# Patient Record
Sex: Female | Born: 1990 | Race: Black or African American | Hispanic: No | Marital: Single | Smoking: Never smoker
Health system: Southern US, Community
[De-identification: ages and names within clinical notes are randomized; demographics above are authoritative.]

## PROBLEM LIST (undated history)

## (undated) ENCOUNTER — Inpatient Hospital Stay (HOSPITAL_COMMUNITY): Payer: Self-pay

## (undated) DIAGNOSIS — R51 Headache: Secondary | ICD-10-CM

## (undated) DIAGNOSIS — D649 Anemia, unspecified: Secondary | ICD-10-CM

## (undated) DIAGNOSIS — N730 Acute parametritis and pelvic cellulitis: Secondary | ICD-10-CM

## (undated) DIAGNOSIS — E611 Iron deficiency: Secondary | ICD-10-CM

## (undated) DIAGNOSIS — R519 Headache, unspecified: Secondary | ICD-10-CM

## (undated) HISTORY — PX: EYE SURGERY: SHX253

## (undated) HISTORY — DX: Acute parametritis and pelvic cellulitis: N73.0

---

## 2011-11-14 ENCOUNTER — Encounter (HOSPITAL_COMMUNITY): Payer: Self-pay

## 2011-11-14 ENCOUNTER — Emergency Department (HOSPITAL_COMMUNITY)
Admission: EM | Admit: 2011-11-14 | Discharge: 2011-11-14 | Disposition: A | Discharge status: Home / Self Care | Payer: BC Managed Care – PPO | Attending: Emergency Medicine | Admitting: Emergency Medicine

## 2011-11-14 ENCOUNTER — Emergency Department (HOSPITAL_COMMUNITY): Payer: BC Managed Care – PPO

## 2011-11-14 DIAGNOSIS — B9789 Other viral agents as the cause of diseases classified elsewhere: Secondary | ICD-10-CM | POA: Insufficient documentation

## 2011-11-14 DIAGNOSIS — B349 Viral infection, unspecified: Secondary | ICD-10-CM

## 2011-11-14 LAB — COMPREHENSIVE METABOLIC PANEL
BUN: 9 mg/dL (ref 6–23)
CO2: 25 mEq/L (ref 19–32)
Calcium: 9.7 mg/dL (ref 8.4–10.5)
Creatinine, Ser: 0.65 mg/dL (ref 0.50–1.10)
GFR calc Af Amer: >90 mL/min (ref >90)
GFR calc non Af Amer: >90 mL/min (ref >90)
Glucose, Bld: 91 mg/dL (ref 70–99)

## 2011-11-14 LAB — CBC WITH DIFFERENTIAL/PLATELET
Eosinophils Relative: 1 % (ref 0–5)
HCT: 34.8 % — ABNORMAL LOW (ref 36.0–46.0)
Lymphocytes Relative: 37 % (ref 12–46)
Lymphs Abs: 1.1 10*3/uL (ref 0.7–4.0)
MCV: 80.7 fL (ref 78.0–100.0)
Monocytes Absolute: 0.3 10*3/uL (ref 0.1–1.0)
RBC: 4.31 MIL/uL (ref 3.87–5.11)
WBC: 2.9 10*3/uL — ABNORMAL LOW (ref 4.0–10.5)

## 2011-11-14 LAB — URINALYSIS, ROUTINE W REFLEX MICROSCOPIC
Protein, ur: NEGATIVE mg/dL
Urobilinogen, UA: 1 mg/dL (ref 0.0–1.0)

## 2011-11-14 LAB — POCT I-STAT TROPONIN I: Troponin i, poc: 0.01 ng/mL (ref 0.00–0.08)

## 2011-11-14 LAB — URINE MICROSCOPIC-ADD ON

## 2011-11-14 NOTE — ED Provider Notes (Signed)
History   This chart was scribed for Evelyn Baker, MD by Charolett Bumpers . The patient was seen in room TR02C/TR02C. Patient's care was started at 1438.   CSN: [id number] Arrival date & time 11/14/11  1324  First MD Initiated Contact with Patient 11/14/11 1438      Chief Complaint  Patient presents with  . Palpitations    The history is provided by the patient. No language interpreter was used.  Evelyn Sullivan is a 21 y.o. female who presents to the Emergency Department complaining of a intermittent episodes of palpitations that occurred last night while going to bed. She reports associated chills. She states that she is going to travel by airplane today and wants to get evaluated prior. She denies any fevers, chest pain, SOB, cough, vomiting, diarrhea, dysuria, sore throat, ear pain or congestion. She denies any recent illnesses. She denies any modifying factors. Denies any complaints of pain at this time.   No past medical history on file.  No past surgical history on file.  No family history on file.  History  Substance Use Topics  . Smoking status: Not on file  . Smokeless tobacco: Not on file  . Alcohol Use: Not on file    OB History    Grav Para Term Preterm Abortions TAB SAB Ect Mult Living                  Review of Systems  Constitutional: Positive for chills. Negative for fever.  HENT: Negative for ear pain, congestion and sore throat.   Respiratory: Negative for cough and shortness of breath.   Cardiovascular: Positive for palpitations. Negative for chest pain.  Gastrointestinal: Negative for vomiting, abdominal pain and diarrhea.  Genitourinary: Negative for dysuria.  Neurological: Negative for weakness.  All other systems reviewed and are negative.    Allergies  Review of patient's allergies indicates no known allergies.  Home Medications   Current Outpatient Rx  Name Route Sig Dispense Refill  . ADVIL PO Oral Take 2 tablets by mouth  every 8 (eight) hours as needed. For pain      BP 107/62  Pulse 94  Temp 98.9 F (37.2 C) (Oral)  Resp 18  SpO2 95%  LMP 11/13/2011  Physical Exam  Nursing note and vitals reviewed. Constitutional: She is oriented to person, place, and time. She appears well-developed and well-nourished.  Non-toxic appearance. No distress.  HENT:  Head: Normocephalic and atraumatic.  Mouth/Throat: Oropharynx is clear and moist. No oropharyngeal exudate.  Eyes: Conjunctivae normal, EOM and lids are normal. Pupils are equal, round, and reactive to light.  Neck: Normal range of motion. Neck supple. No tracheal deviation present. No mass present.  Cardiovascular: Normal rate, regular rhythm and normal heart sounds.  Exam reveals no gallop.   No murmur heard. Pulmonary/Chest: Effort normal and breath sounds normal. No stridor. No respiratory distress. She has no decreased breath sounds. She has no wheezes. She has no rhonchi. She has no rales.  Abdominal: Soft. Normal appearance and bowel sounds are normal. She exhibits no distension. There is no tenderness. There is no rebound and no CVA tenderness.  Musculoskeletal: Normal range of motion. She exhibits no edema and no tenderness.  Neurological: She is alert and oriented to person, place, and time. She has normal strength. No cranial nerve deficit or sensory deficit. GCS eye subscore is 4. GCS verbal subscore is 5. GCS motor subscore is 6.  Skin: Skin is warm and dry. No abrasion  and no rash noted.  Psychiatric: She has a normal mood and affect. Her speech is normal and behavior is normal.    ED Course  Procedures (including critical care time)  DIAGNOSTIC STUDIES: Oxygen Saturation is 95% on room air, adequate by my interpretation.    COORDINATION OF CARE:  15:03-Discussed planned course of treatment with the patient including a chest x-ray, UA and blood work, who is agreeable at this time.   15:48-Recheck: Informed pt of normal imaging and lab  results. Will d/c home.   Results for orders placed during the hospital encounter of 11/14/11  CBC WITH DIFFERENTIAL      Component Value Range   WBC 2.9 (*) 4.0 - 10.5 K/uL   RBC 4.31  3.87 - 5.11 MIL/uL   Hemoglobin 11.4 (*) 12.0 - 15.0 g/dL   HCT 21.3 (*) 08.6 - 57.8 %   MCV 80.7  78.0 - 100.0 fL   MCH 26.5  26.0 - 34.0 pg   MCHC 32.8  30.0 - 36.0 g/dL   RDW 46.9  62.9 - 52.8 %   Platelets 208  150 - 400 K/uL   Neutrophils Relative 53  43 - 77 %   Neutro Abs 1.5 (*) 1.7 - 7.7 K/uL   Lymphocytes Relative 37  12 - 46 %   Lymphs Abs 1.1  0.7 - 4.0 K/uL   Monocytes Relative 9  3 - 12 %   Monocytes Absolute 0.3  0.1 - 1.0 K/uL   Eosinophils Relative 1  0 - 5 %   Eosinophils Absolute 0.0  0.0 - 0.7 K/uL   Basophils Relative 0  0 - 1 %   Basophils Absolute 0.0  0.0 - 0.1 K/uL  COMPREHENSIVE METABOLIC PANEL      Component Value Range   Sodium 136  135 - 145 mEq/L   Potassium 3.9  3.5 - 5.1 mEq/L   Chloride 102  96 - 112 mEq/L   CO2 25  19 - 32 mEq/L   Glucose, Bld 91  70 - 99 mg/dL   BUN 9  6 - 23 mg/dL   Creatinine, Ser 4.13  0.50 - 1.10 mg/dL   Calcium 9.7  8.4 - 24.4 mg/dL   Total Protein 8.1  6.0 - 8.3 g/dL   Albumin 4.1  3.5 - 5.2 g/dL   AST 17  0 - 37 U/L   ALT 7  0 - 35 U/L   Alkaline Phosphatase 52  39 - 117 U/L   Total Bilirubin 0.3  0.3 - 1.2 mg/dL   GFR calc non Af Amer >90  >90 mL/min   GFR calc Af Amer >90  >90 mL/min  TROPONIN I      Component Value Range   Troponin I <0.30  <0.30 ng/mL  URINALYSIS, ROUTINE W REFLEX MICROSCOPIC      Component Value Range   Color, Urine AMBER (*) YELLOW   APPearance CLOUDY (*) CLEAR   Specific Gravity, Urine 1.039 (*) 1.005 - 1.030   pH 6.5  5.0 - 8.0   Glucose, UA NEGATIVE  NEGATIVE mg/dL   Hgb urine dipstick LARGE (*) NEGATIVE   Bilirubin Urine NEGATIVE  NEGATIVE   Ketones, ur 15 (*) NEGATIVE mg/dL   Protein, ur NEGATIVE  NEGATIVE mg/dL   Urobilinogen, UA 1.0  0.0 - 1.0 mg/dL   Nitrite NEGATIVE  NEGATIVE    Leukocytes, UA NEGATIVE  NEGATIVE  POCT I-STAT TROPONIN I      Component Value Range  Troponin i, poc 0.01  0.00 - 0.08 ng/mL   Comment 3           URINE MICROSCOPIC-ADD ON      Component Value Range   Squamous Epithelial / LPF RARE  RARE   WBC, UA 0-2  <3 WBC/hpf   RBC / HPF TOO NUMEROUS TO COUNT  <3 RBC/hpf   Bacteria, UA RARE  RARE   Urine-Other MUCOUS PRESENT      Dg Chest 2 View  11/14/2011  *RADIOLOGY REPORT*  Clinical Data: Rapid heartbeat  CHEST - 2 VIEW  Comparison: None.  Findings: Mild to moderate convex right scoliotic curvature of the thoracic spine.  Heart size and vascular pattern are normal.  Lungs are clear.  IMPRESSION: No acute findings.   Original Report Authenticated By: Otilio Carpen, M.D.      No diagnosis found.    MDM  Patient's labs and x-rays reviewed. She likely has a viral illness. 2 stable for discharge   I personally performed the services described in this documentation, which was scribed in my presence. The recorded information has been reviewed and considered.     Rate: 81   Rhythm: normal sinus rhythm  QRS Axis: normal  Intervals: normal  ST/T Wave abnormalities: normal  Conduction Disutrbances:none  Narrative Interpretation:   Old EKG Reviewed: none available    Evelyn Baker, MD 11/14/11 1549

## 2011-11-14 NOTE — ED Notes (Signed)
Pt complaisn of heart racing last night and shivering.

## 2011-11-14 NOTE — ED Notes (Signed)
Pt had an episode last pm of "heart racing". States she drank milk which made her feel better. She is going to be traveling by airplane today so wanted to "get checked first". Denies CP or SOB.

## 2012-01-30 ENCOUNTER — Encounter (HOSPITAL_COMMUNITY): Payer: Self-pay | Admitting: Adult Health

## 2012-01-30 ENCOUNTER — Emergency Department (HOSPITAL_COMMUNITY)
Admission: EM | Admit: 2012-01-30 | Discharge: 2012-01-31 | Disposition: A | Discharge status: Home / Self Care | Payer: BC Managed Care – PPO | Attending: Emergency Medicine | Admitting: Emergency Medicine

## 2012-01-30 ENCOUNTER — Emergency Department (HOSPITAL_COMMUNITY): Payer: BC Managed Care – PPO

## 2012-01-30 DIAGNOSIS — R350 Frequency of micturition: Secondary | ICD-10-CM | POA: Insufficient documentation

## 2012-01-30 DIAGNOSIS — R51 Headache: Secondary | ICD-10-CM

## 2012-01-30 DIAGNOSIS — R358 Other polyuria: Secondary | ICD-10-CM

## 2012-01-30 DIAGNOSIS — R3589 Other polyuria: Secondary | ICD-10-CM

## 2012-01-30 DIAGNOSIS — D649 Anemia, unspecified: Secondary | ICD-10-CM

## 2012-01-30 DIAGNOSIS — H53149 Visual discomfort, unspecified: Secondary | ICD-10-CM | POA: Insufficient documentation

## 2012-01-30 LAB — POCT PREGNANCY, URINE: Preg Test, Ur: NEGATIVE

## 2012-01-30 LAB — POCT I-STAT, CHEM 8
HCT: 35 % — ABNORMAL LOW (ref 36.0–46.0)
Hemoglobin: 11.9 g/dL — ABNORMAL LOW (ref 12.0–15.0)
Sodium: 139 mEq/L (ref 135–145)
TCO2: 25 mmol/L (ref 0–100)

## 2012-01-30 LAB — URINALYSIS, ROUTINE W REFLEX MICROSCOPIC
Glucose, UA: NEGATIVE mg/dL
Leukocytes, UA: NEGATIVE
Nitrite: NEGATIVE
Protein, ur: NEGATIVE mg/dL
Urobilinogen, UA: 1 mg/dL (ref 0.0–1.0)

## 2012-01-30 LAB — CBC
MCH: 26.3 pg (ref 26.0–34.0)
MCHC: 32.6 g/dL (ref 30.0–36.0)
Platelets: 156 10*3/uL (ref 150–400)
RBC: 3.96 MIL/uL (ref 3.87–5.11)
RDW: 13.7 % (ref 11.5–15.5)

## 2012-01-30 MED ORDER — IOHEXOL 350 MG/ML SOLN
50.0000 mL | Freq: Once | INTRAVENOUS | Status: AC | PRN
  Administered 2012-01-30: 50 mL via INTRAVENOUS

## 2012-01-30 MED ORDER — METOCLOPRAMIDE HCL 5 MG/ML IJ SOLN
10.0000 mg | Freq: Once | INTRAMUSCULAR | Status: AC
  Administered 2012-01-30: 10 mg via INTRAVENOUS
  Filled 2012-01-30: qty 2

## 2012-01-30 MED ORDER — KETOROLAC TROMETHAMINE 30 MG/ML IJ SOLN
30.0000 mg | Freq: Once | INTRAMUSCULAR | Status: AC
  Administered 2012-01-30: 30 mg via INTRAVENOUS
  Filled 2012-01-30: qty 1

## 2012-01-30 MED ORDER — SODIUM CHLORIDE 0.9 % IV SOLN
Freq: Once | INTRAVENOUS | Status: AC
  Administered 2012-01-30: 21:00:00 via INTRAVENOUS

## 2012-01-30 MED ORDER — DIPHENHYDRAMINE HCL 50 MG/ML IJ SOLN
12.5000 mg | Freq: Once | INTRAMUSCULAR | Status: AC
  Administered 2012-01-30: 12.5 mg via INTRAVENOUS
  Filled 2012-01-30: qty 1

## 2012-01-30 NOTE — ED Notes (Addendum)
Pt reports Headache that is constant and began one week ago. Denies nausea, reports reports sensitivity to light and sound. PERRLA, answers all questions appropriately. Pt reports taking advil for headache with no relief. Also c/o frequent urination that began 2 days ago and inability to empty bladder completely. She denies possibility of pregnancy due to abstinence. She is concerned because she has not had a period for over 2 months.

## 2012-01-30 NOTE — ED Notes (Signed)
Patient transported to CT 

## 2012-01-30 NOTE — ED Provider Notes (Signed)
History     CSN: [id number]  Arrival date & time 01/30/12  1856   First MD Initiated Contact with Patient 01/30/12 1945      No chief complaint on file.   (Consider location/radiation/quality/duration/timing/severity/associated sxs/prior treatment) HPI Evelyn Sullivan is a 22 y.o. female who presents with complaint of headache, urinary frequency. State symptoms began 3 days ago, gradually worsening. No hx of headaches. States taking ibuprofen with no relief. No weakness or numbness of extremities. No fever. No neck pain or stiffness. States feeling weak. Sensetive to light and sound. States urinating every few minutes. No pain with urination, no flank pain. States no history of the same. Otherwise healthy.  History reviewed. No pertinent past medical history.  History reviewed. No pertinent past surgical history.  History reviewed. No pertinent family history.  History  Substance Use Topics  . Smoking status: Never Smoker   . Smokeless tobacco: Not on file  . Alcohol Use: No    OB History    Grav Para Term Preterm Abortions TAB SAB Ect Mult Living                  Review of Systems  Constitutional: Negative for fever and chills.  HENT: Negative for congestion, sore throat, facial swelling, neck pain and neck stiffness.   Eyes: Positive for photophobia. Negative for visual disturbance.  Respiratory: Negative.   Cardiovascular: Negative.   Neurological: Positive for headaches.  All other systems reviewed and are negative.    Allergies  Review of patient's allergies indicates no known allergies.  Home Medications   Current Outpatient Rx  Name  Route  Sig  Dispense  Refill  . IBUPROFEN 200 MG PO TABS   Oral   Take 400 mg by mouth every 8 (eight) hours as needed. For headache           BP 135/73  Pulse 88  Temp 98.4 F (36.9 C) (Oral)  Resp 16  SpO2 100%  Physical Exam  Nursing note and vitals reviewed. Constitutional: She is oriented to person,  place, and time. She appears well-developed and well-nourished. No distress.  HENT:  Head: Normocephalic and atraumatic.  Right Ear: External ear normal.  Left Ear: External ear normal.  Nose: Nose normal.  Mouth/Throat: Oropharynx is clear and moist.  Eyes: Conjunctivae normal are normal. Pupils are equal, round, and reactive to light.  Neck: Neck supple.  Cardiovascular: Normal rate, regular rhythm and normal heart sounds.   Pulmonary/Chest: Effort normal and breath sounds normal. No respiratory distress. She has no wheezes. She has no rales.  Abdominal: Soft. Bowel sounds are normal. She exhibits no distension. There is no tenderness. There is no rebound.  Musculoskeletal: She exhibits no edema.  Neurological: She is alert and oriented to person, place, and time. She has normal reflexes. No cranial nerve deficit. Coordination normal.       Normal coordination, finger to nose  Skin: Skin is warm and dry.  Psychiatric: She has a normal mood and affect.    ED Course  Procedures (including critical care time)  Results for orders placed during the hospital encounter of 01/30/12  URINALYSIS, ROUTINE W REFLEX MICROSCOPIC      Component Value Range   Color, Urine YELLOW  YELLOW   APPearance CLEAR  CLEAR   Specific Gravity, Urine <1.005 (*) 1.005 - 1.030   pH 6.0  5.0 - 8.0   Glucose, UA NEGATIVE  NEGATIVE mg/dL   Hgb urine dipstick NEGATIVE  NEGATIVE  Bilirubin Urine NEGATIVE  NEGATIVE   Ketones, ur NEGATIVE  NEGATIVE mg/dL   Protein, ur NEGATIVE  NEGATIVE mg/dL   Urobilinogen, UA 1.0  0.0 - 1.0 mg/dL   Nitrite NEGATIVE  NEGATIVE   Leukocytes, UA NEGATIVE  NEGATIVE  POCT PREGNANCY, URINE      Component Value Range   Preg Test, Ur NEGATIVE  NEGATIVE  GLUCOSE, CAPILLARY      Component Value Range   Glucose-Capillary 111 (*) 70 - 99 mg/dL  CBC      Component Value Range   WBC 5.7  4.0 - 10.5 K/uL   RBC 3.96  3.87 - 5.11 MIL/uL   Hemoglobin 10.4 (*) 12.0 - 15.0 g/dL   HCT 95.6  (*) 21.3 - 46.0 %   MCV 80.6  78.0 - 100.0 fL   MCH 26.3  26.0 - 34.0 pg   MCHC 32.6  30.0 - 36.0 g/dL   RDW 08.6  57.8 - 46.9 %   Platelets 156  150 - 400 K/uL  POCT I-STAT, CHEM 8      Component Value Range   Sodium 139  135 - 145 mEq/L   Potassium 3.6  3.5 - 5.1 mEq/L   Chloride 103  96 - 112 mEq/L   BUN 7  6 - 23 mg/dL   Creatinine, Ser 6.29  0.50 - 1.10 mg/dL   Glucose, Bld 528 (*) 70 - 99 mg/dL   Calcium, Ion 4.13 (*) 1.12 - 1.23 mmol/L   TCO2 25  0 - 100 mmol/L   Hemoglobin 11.9 (*) 12.0 - 15.0 g/dL   HCT 24.4 (*) 01.0 - 27.2 %   Ct Angio Head W/cm &/or Wo Cm  01/31/2012   *RADIOLOGY REPORT*  Clinical Data:  Evaluate for pituitary mass.  No menstrual cycle for 2 months.  CT ANGIOGRAPHY HEAD  Technique:  Multidetector CT imaging of the head was performed using the standard protocol during bolus administration of intravenous contrast.  Multiplanar CT image reconstructions including MIPs were obtained to evaluate the vascular anatomy.  Contrast: 50mL OMNIPAQUE IOHEXOL 350 MG/ML SOLN  Comparison:   None.  Findings:  The internal carotid arteries are widely patent.  The basilar artery is widely patent.  There is no proximal stenosis of the intracranial vasculature.  There is no visible berry aneurysm. Post infusion appearance of the brain demonstrates normal ventricles, cisterns, and sulci without areas of acute or chronic ischemia, hemorrhage, mass lesion or hydrocephalus.  Calvarium is intact.  The frontal, ethmoid, and sphenoid sinuses are clear.  There is moderate fluid accumulation in the right maxillary sinus which is incompletely evaluated.  This could serve as a source of headache.  Multiplanar thin section imaging was directed toward the sella turcica.  There is no apparent pituitary enlargement.  No cavernous sinus mass is seen.   Review of the MIP images confirms the above findings.  IMPRESSION: Negative CTA head.  No visible acute or focal intracranial process. No vascular occlusion  or aneurysm.  Incompletely evaluated right maxillary sinus; acute or chronic sinus disease is not excluded.   Original Report Authenticated By: Davonna Belling, M.D.       1. Headache   2. Polyuria   3. Anemia       MDM  Pt with headache for last three days, as well as no menstrual cycle, and polyuria.Pt's headache does not suggest any emergent condition. It is gradual in onset. No neuro deficits.  Pt's UA showed low specific gravity of <1.005,  it is otherwise normal. Her blood glucose is normal at 101. Headache treated with toradol. Reglan, benadryl. Headache improved. Pt feeling much better. concern for possible diabetes insipidus, possible disorder of pituatery on differential. Pt's electrolytes all normal today. Her CT head (which I ordered non contrasted, and somehow it got changed into CT angio head by radiology tech?), is normal. Pt will benefit from non emergent MRI and more studies for further diagnosis. Pt will be discharged home, Excedrin migraine for headache. Instructed to return if worsening.     Filed Vitals:   01/30/12 2352  BP: 120/63  Pulse: 79  Temp: 98 F (36.7 C)  Resp: [address] Yeilin Zweber, [place] 01/31/12 0056

## 2012-01-31 NOTE — ED Notes (Signed)
The patient is AOx4 and comfortable with her discharge instructions. 

## 2012-01-31 NOTE — ED Provider Notes (Signed)
Medical screening examination/treatment/procedure(s) were performed by non-physician practitioner and as supervising physician I was immediately available for consultation/collaboration.  Juliet Rude. Rubin Payor, MD 01/31/12 [id number]

## 2012-06-19 ENCOUNTER — Encounter (HOSPITAL_COMMUNITY): Payer: Self-pay | Admitting: *Deleted

## 2012-06-19 DIAGNOSIS — Z3202 Encounter for pregnancy test, result negative: Secondary | ICD-10-CM | POA: Insufficient documentation

## 2012-06-19 DIAGNOSIS — D72819 Decreased white blood cell count, unspecified: Secondary | ICD-10-CM | POA: Insufficient documentation

## 2012-06-19 DIAGNOSIS — D649 Anemia, unspecified: Secondary | ICD-10-CM | POA: Insufficient documentation

## 2012-06-19 DIAGNOSIS — R55 Syncope and collapse: Secondary | ICD-10-CM | POA: Insufficient documentation

## 2012-06-19 DIAGNOSIS — R5381 Other malaise: Secondary | ICD-10-CM | POA: Insufficient documentation

## 2012-06-19 LAB — CBC
HCT: 34.1 % — ABNORMAL LOW (ref 36.0–46.0)
MCH: 27.5 pg (ref 26.0–34.0)
MCV: 83 fL (ref 78.0–100.0)
Platelets: 183 10*3/uL (ref 150–400)
RBC: 4.11 MIL/uL (ref 3.87–5.11)

## 2012-06-19 LAB — COMPREHENSIVE METABOLIC PANEL
BUN: 11 mg/dL (ref 6–23)
CO2: 25 mEq/L (ref 19–32)
Calcium: 9.6 mg/dL (ref 8.4–10.5)
Chloride: 101 mEq/L (ref 96–112)
Creatinine, Ser: 0.71 mg/dL (ref 0.50–1.10)
GFR calc Af Amer: >90 mL/min (ref >90)
GFR calc non Af Amer: >90 mL/min (ref >90)
Glucose, Bld: 97 mg/dL (ref 70–99)
Total Bilirubin: 0.2 mg/dL — ABNORMAL LOW (ref 0.3–1.2)

## 2012-06-19 LAB — URINALYSIS, ROUTINE W REFLEX MICROSCOPIC
Nitrite: NEGATIVE
Protein, ur: NEGATIVE mg/dL
Specific Gravity, Urine: 1.035 — ABNORMAL HIGH (ref 1.005–1.030)
Urobilinogen, UA: 1 mg/dL (ref 0.0–1.0)

## 2012-06-19 LAB — POCT PREGNANCY, URINE: Preg Test, Ur: NEGATIVE

## 2012-06-19 NOTE — ED Notes (Addendum)
Pt. Fainted x 2 today: 0940 pm and while in waiting room. "don't know why." no n/v. No cp.

## 2012-06-20 ENCOUNTER — Emergency Department (HOSPITAL_COMMUNITY)
Admission: EM | Admit: 2012-06-20 | Discharge: 2012-06-20 | Disposition: A | Discharge status: Home / Self Care | Payer: Self-pay | Attending: Emergency Medicine | Admitting: Emergency Medicine

## 2012-06-20 DIAGNOSIS — D649 Anemia, unspecified: Secondary | ICD-10-CM

## 2012-06-20 DIAGNOSIS — D72819 Decreased white blood cell count, unspecified: Secondary | ICD-10-CM

## 2012-06-20 DIAGNOSIS — R55 Syncope and collapse: Secondary | ICD-10-CM

## 2012-06-20 HISTORY — DX: Anemia, unspecified: D64.9

## 2012-06-20 HISTORY — DX: Iron deficiency: E61.1

## 2012-06-20 HISTORY — DX: Headache: R51

## 2012-06-20 HISTORY — DX: Headache, unspecified: R51.9

## 2012-06-20 LAB — POCT I-STAT, CHEM 8
BUN: 12 mg/dL (ref 6–23)
Calcium, Ion: 1.35 mmol/L — ABNORMAL HIGH (ref 1.12–1.23)
Hemoglobin: 12.2 g/dL (ref 12.0–15.0)
Sodium: 140 mEq/L (ref 135–145)
TCO2: 27 mmol/L (ref 0–100)

## 2012-06-20 NOTE — ED Notes (Signed)
New and old EKG given to Dr. Lavella Lemons. Copy placed in pt chart.

## 2012-06-20 NOTE — ED Provider Notes (Signed)
History     CSN: [id number]  Arrival date & time 06/19/12  2150   First MD Initiated Contact with Patient 06/20/12 0246      Chief Complaint  Patient presents with  . Loss of Consciousness    (Consider location/radiation/quality/duration/timing/severity/associated sxs/prior treatment) HPI This patient is a generally healthy young woman who is an immigrant from [place] couple of years ago. She presents today from home with complaints of syncope. The patient says that she got out of bed and walked to her bathroom, felt generally weak and collapsed onto the floor. She was found by her father. He says that her eyes closed she was arousable.     The patient denies any incontinence of urine. The patient's father did not appreciate any unusual motor or seizure-like activity. The patient reports a normal by mouth intake. She denies nausea, vomiting, diarrhea, hematochezia and melena. She also denies chest pain shortness of breath. She is only taking an iron supplement and ibuprofen as needed. No new medications.  The patient says that this is the fourth such episode which she has suffered over the past 12 months. On each occasion, she has not medical evaluation. She says that she has been given several diagnoses including anemia secondary to heavy periods and dehydration.  Past Medical History  Diagnosis Date  . Iron deficiency   . Headache   . Anemia     No past surgical history on file.  No family history on file.  History  Substance Use Topics  . Smoking status: Never Smoker   . Smokeless tobacco: Not on file  . Alcohol Use: No    OB History   Grav Para Term Preterm Abortions TAB SAB Ect Mult Living                  Review of Systems Gen: no weight loss, fevers, chills, night sweats Eyes: no discharge or drainage, no occular pain or visual changes Nose: no epistaxis or rhinorrhea Mouth: no dental pain, no sore throat Neck: no neck pain Lungs: no SOB, cough,  wheezing CV: no chest pain, palpitations, dependent edema or orthopnea Abd: no abdominal pain, nausea, vomiting GU: no dysuria or gross hematuria MSK: no myalgias or arthralgias Neuro: As per history of present illness, otherwise negative Skin: no rash Psyche: negative.  Allergies  Review of patient's allergies indicates no known allergies.  Home Medications   Current Outpatient Rx  Name  Route  Sig  Dispense  Refill  . ibuprofen (ADVIL,MOTRIN) 200 MG tablet   Oral   Take 400 mg by mouth every 8 (eight) hours as needed. For headache         . Multiple Vitamin (MULTIVITAMIN WITH MINERALS) TABS   Oral   Take 1 tablet by mouth daily.           BP 115/59  Pulse 62  Temp(Src) 97.8 F (36.6 C) (Oral)  Resp 14  SpO2 98%  LMP 06/12/2012  Physical Exam Gen: well developed and well nourished appearing Head: NCAT Eyes: PERL, EOMI Nose: no epistaixis or rhinorrhea Mouth/throat: mucosa is moist and pink Neck: supple, no stridor Lungs: CTA B, no wheezing, rhonchi or rales CV: RRR, no murmur, pulses strong and equal bilaterally.  Abd: soft, notender, nondistended Back: no ttp, no cva ttp Skin: no rashese, wnl Neuro: CN ii-xii grossly intact, no focal deficits, speech is normal, gait is normal, motor strength is 5 over 5 in nature muscle groups of both upper lower 20s. Psyche;  normal affect,  calm and cooperative.   ED Course  Procedures (including critical care time)   EKG: nsr, no acute ischemic changes, normal intervals, normal axis, normal qrs complex  Results for orders placed during the hospital encounter of 06/20/12 (from the past 24 hour(s))  CBC     Status: Abnormal   Collection Time    06/19/12 10:20 PM      Result Value Range   WBC 3.8 (*) 4.0 - 10.5 K/uL   RBC 4.11  3.87 - 5.11 MIL/uL   Hemoglobin 11.3 (*) 12.0 - 15.0 g/dL   HCT 45.4 (*) 09.8 - 11.9 %   MCV 83.0  78.0 - 100.0 fL   MCH 27.5  26.0 - 34.0 pg   MCHC 33.1  30.0 - 36.0 g/dL   RDW 14.7   82.9 - 56.2 %   Platelets 183  150 - 400 K/uL  COMPREHENSIVE METABOLIC PANEL     Status: Abnormal   Collection Time    06/19/12 10:20 PM      Result Value Range   Sodium 135  135 - 145 mEq/L   Potassium 4.0  3.5 - 5.1 mEq/L   Chloride 101  96 - 112 mEq/L   CO2 25  19 - 32 mEq/L   Glucose, Bld 97  70 - 99 mg/dL   BUN 11  6 - 23 mg/dL   Creatinine, Ser 1.30  0.50 - 1.10 mg/dL   Calcium 9.6  8.4 - 86.5 mg/dL   Total Protein 7.5  6.0 - 8.3 g/dL   Albumin 3.7  3.5 - 5.2 g/dL   AST 17  0 - 37 U/L   ALT 9  0 - 35 U/L   Alkaline Phosphatase 46  39 - 117 U/L   Total Bilirubin 0.2 (*) 0.3 - 1.2 mg/dL   GFR calc non Af Amer >90  >90 mL/min   GFR calc Af Amer >90  >90 mL/min  POCT PREGNANCY, URINE     Status: None   Collection Time    06/19/12 10:30 PM      Result Value Range   Preg Test, Ur NEGATIVE  NEGATIVE  URINALYSIS, ROUTINE W REFLEX MICROSCOPIC     Status: Abnormal   Collection Time    06/19/12 10:32 PM      Result Value Range   Color, Urine YELLOW  YELLOW   APPearance CLOUDY (*) CLEAR   Specific Gravity, Urine 1.035 (*) 1.005 - 1.030   pH 6.0  5.0 - 8.0   Glucose, UA NEGATIVE  NEGATIVE mg/dL   Hgb urine dipstick NEGATIVE  NEGATIVE   Bilirubin Urine NEGATIVE  NEGATIVE   Ketones, ur NEGATIVE  NEGATIVE mg/dL   Protein, ur NEGATIVE  NEGATIVE mg/dL   Urobilinogen, UA 1.0  0.0 - 1.0 mg/dL   Nitrite NEGATIVE  NEGATIVE   Leukocytes, UA NEGATIVE  NEGATIVE  POCT I-STAT, CHEM 8     Status: Abnormal   Collection Time    06/20/12  1:15 AM      Result Value Range   Sodium 140  135 - 145 mEq/L   Potassium 4.0  3.5 - 5.1 mEq/L   Chloride 106  96 - 112 mEq/L   BUN 12  6 - 23 mg/dL   Creatinine, Ser 7.84  0.50 - 1.10 mg/dL   Glucose, Bld 84  70 - 99 mg/dL   Calcium, Ion 6.96 (*) 1.12 - 1.23 mmol/L   TCO2 27  0 - 100 mmol/L  Hemoglobin 12.2  12.0 - 15.0 g/dL   HCT 16.1  09.6 - 04.5 %      MDM  Patient's emergency department workup is nondiagnostic. She's been asymptomatic in  the emergency department. She has normal orthostatic vital signs and has ambulated about the department without difficulty her symptoms. We have confirmed that she is not pregnant. She is a normal urinalysis. Normal renal function. No significant electrolyte abnormalities.  The patient's CBC is notable for very mild leukopenia which has been present in the past. The patient says she had not been made aware of this. We discussed this finding and need for followup and further evaluation. The patient has mild anemia which is likely secondary to menstrual periods. Her hemoglobin is stable in light of comparisons.   I have discussed all of the patient's findings with the patient and her father. I have recommended that the patient followup with both her primary care provider as well as cardiology for consideration of an outpatient cardiac monitoring. The patient is counseled to return promptly to the emergency department for any red flag symptoms.        Brandt Loosen, MD 06/20/12 [phone]

## 2012-06-20 NOTE — ED Notes (Signed)
No new changes from triage assessment 

## 2012-06-20 NOTE — ED Notes (Signed)
Pt ambulated without difficulty. Pt stated that she felt lightheaded.

## 2012-06-20 NOTE — ED Notes (Signed)
NURSE FIRST ROUNDS : NURSE EXPLAINED DELAY , WAIT TIME AND PROCESS ,  ALERT AND ORIENTED , RESPIRATIONS UNLABORED , RESPIRATIONS UNLABORED , PT. SITTING AT WAITING AREA WITH FAMILY WITH NO DISTRESS.

## 2012-12-03 ENCOUNTER — Emergency Department (HOSPITAL_BASED_OUTPATIENT_CLINIC_OR_DEPARTMENT_OTHER)
Admission: EM | Admit: 2012-12-03 | Discharge: 2012-12-03 | Disposition: A | Discharge status: Home / Self Care | Payer: Medicaid - Out of State | Attending: Emergency Medicine | Admitting: Emergency Medicine

## 2012-12-03 ENCOUNTER — Encounter (HOSPITAL_BASED_OUTPATIENT_CLINIC_OR_DEPARTMENT_OTHER): Payer: Self-pay | Admitting: Emergency Medicine

## 2012-12-03 DIAGNOSIS — R1031 Right lower quadrant pain: Secondary | ICD-10-CM | POA: Insufficient documentation

## 2012-12-03 DIAGNOSIS — Z862 Personal history of diseases of the blood and blood-forming organs and certain disorders involving the immune mechanism: Secondary | ICD-10-CM | POA: Insufficient documentation

## 2012-12-03 DIAGNOSIS — Z3202 Encounter for pregnancy test, result negative: Secondary | ICD-10-CM | POA: Insufficient documentation

## 2012-12-03 DIAGNOSIS — N946 Dysmenorrhea, unspecified: Secondary | ICD-10-CM | POA: Insufficient documentation

## 2012-12-03 LAB — URINALYSIS, ROUTINE W REFLEX MICROSCOPIC
Glucose, UA: NEGATIVE mg/dL
Ketones, ur: NEGATIVE mg/dL
Protein, ur: NEGATIVE mg/dL
Urobilinogen, UA: 1 mg/dL (ref 0.0–1.0)

## 2012-12-03 LAB — WET PREP, GENITAL
Clue Cells Wet Prep HPF POC: NONE SEEN
Trich, Wet Prep: NONE SEEN

## 2012-12-03 LAB — URINE MICROSCOPIC-ADD ON

## 2012-12-03 LAB — PREGNANCY, URINE: Preg Test, Ur: NEGATIVE

## 2012-12-03 MED ORDER — HYDROMORPHONE HCL PF 1 MG/ML IJ SOLN
1.0000 mg | Freq: Once | INTRAMUSCULAR | Status: AC
  Administered 2012-12-03: 1 mg via INTRAMUSCULAR
  Filled 2012-12-03: qty 1

## 2012-12-03 MED ORDER — IBUPROFEN 800 MG PO TABS: 800.0000 mg | ORAL_TABLET | Freq: Three times a day (TID) | ORAL | Status: DC | PRN

## 2012-12-03 MED ORDER — KETOROLAC TROMETHAMINE 60 MG/2ML IM SOLN
60.0000 mg | Freq: Once | INTRAMUSCULAR | Status: AC
  Administered 2012-12-03: 60 mg via INTRAMUSCULAR
  Filled 2012-12-03: qty 2

## 2012-12-03 MED ORDER — ONDANSETRON 4 MG PO TBDP
4.0000 mg | ORAL_TABLET | Freq: Once | ORAL | Status: AC
  Administered 2012-12-03: 4 mg via ORAL
  Filled 2012-12-03: qty 1

## 2012-12-03 NOTE — ED Notes (Signed)
C/o rt lower abd and back pain x 1 day,  Denies n/v,  No diff w urination

## 2012-12-03 NOTE — ED Provider Notes (Signed)
CSN: [id number]     Arrival date & time 12/03/12  2117 History  This chart was scribed for Charles B. Bernette Mayers, MD by Bennett Scrape, ED Scribe. This patient was seen in room MH01/MH01 and the patient's care was started at 9:37 PM.   Chief Complaint  Patient presents with  . Abdominal Pain    The history is provided by the patient. No language interpreter was used.    HPI Comments: Evelyn Sullivan is a 22 y.o. female who presents to the Emergency Department complaining of gradual onset, gradually worsening, constant RLQ pain that radiates into the right flank and down bilateral legs that started at work this afternoon. She reports one prior episode 4 months ago during which she was evaluated at [facility] in [place], [place] and was given "shots" with improvement. She also mentions that she has been following up with her PCP for menorrhagia and heavy periods. She was started on birth control medications with no improvement. She admits that she started her menses today and is having normal menses cramps. She denies any vaginal discharge, urinary symptoms or emesis. She denies that possibility of pregnancy.   Past Medical History  Diagnosis Date  . Iron deficiency   . Headache   . Anemia    History reviewed. No pertinent past surgical history. History reviewed. No pertinent family history. History  Substance Use Topics  . Smoking status: Never Smoker   . Smokeless tobacco: Not on file  . Alcohol Use: No   No OB history provided.  Review of Systems  A complete 10 system review of systems was obtained and all systems are negative except as noted in the HPI and PMH.   Allergies  Review of patient's allergies indicates no known allergies.  Home Medications   Current Outpatient Rx  Name  Route  Sig  Dispense  Refill  . ibuprofen (ADVIL,MOTRIN) 200 MG tablet   Oral   Take 400 mg by mouth every 8 (eight) hours as needed. For headache         . Multiple  Vitamin (MULTIVITAMIN WITH MINERALS) TABS   Oral   Take 1 tablet by mouth daily.          Triage Vitals:  BP 109/56  Pulse 77  Temp(Src) 98.3 F (36.8 C) (Oral)  Resp 16  Ht 5\' 3"  (1.6 m)  Wt 145 lb (65.772 kg)  BMI 25.69 kg/m2  SpO2 100%  LMP 12/03/2012  Physical Exam  Nursing note and vitals reviewed. Constitutional: She is oriented to person, place, and time. She appears well-developed and well-nourished.  HENT:  Head: Normocephalic and atraumatic.  Eyes: EOM are normal. Pupils are equal, round, and reactive to light.  Neck: Normal range of motion. Neck supple.  Cardiovascular: Normal rate, normal heart sounds and intact distal pulses.   Pulmonary/Chest: Effort normal and breath sounds normal.  Abdominal: Bowel sounds are normal. She exhibits no distension. There is no tenderness.  Genitourinary:  Diffuse mild, pelvic tenderness on exam, moderate bleeding, unable to visualize cervix, no focal adnexal tenderness or mass  Musculoskeletal: Normal range of motion. She exhibits no edema and no tenderness.  Neurological: She is alert and oriented to person, place, and time. She has normal strength. No cranial nerve deficit or sensory deficit.  Skin: Skin is warm and dry. No rash noted.  Psychiatric: She has a normal mood and affect.    ED Course  Procedures (including critical care time)  Medications  ketorolac (TORADOL) injection 60 mg (  not administered)  HYDROmorphone (DILAUDID) injection 1 mg (not administered)    DIAGNOSTIC STUDIES: Oxygen Saturation is 100% on room air, normal by my interpretation.    COORDINATION OF CARE: 9:43 PM-Discussed treatment plan which includes medications and UA with pt at bedside and pt agreed to plan.   Labs Review Labs Reviewed  WET PREP, GENITAL - Abnormal; Notable for the following:    WBC, Wet Prep HPF POC FEW (*)    All other components within normal limits  URINALYSIS, ROUTINE W REFLEX MICROSCOPIC - Abnormal; Notable for the  following:    Hgb urine dipstick LARGE (*)    Leukocytes, UA SMALL (*)    All other components within normal limits  GC/CHLAMYDIA PROBE AMP  PREGNANCY, URINE  URINE MICROSCOPIC-ADD ON   Imaging Review No results found.  EKG Interpretation   None       MDM   1. Dysmenorrhea     Pt feeling much better with meds, pelvic exam is unremarkable. Pt states similar symptoms with menstrual cramp before.   I personally performed the services described in this documentation, which was scribed in my presence. The recorded information has been reviewed and is accurate.      Charles B. Bernette Mayers, MD 12/03/12 2306

## 2012-12-03 NOTE — ED Notes (Signed)
Pt vomited x 1, md notified 

## 2012-12-03 NOTE — ED Notes (Signed)
Pt c/o lower abd pain and back pain which radiates down both legs, pt reports she is having" period cramps"

## 2013-02-07 ENCOUNTER — Emergency Department (HOSPITAL_COMMUNITY): Payer: PRIVATE HEALTH INSURANCE

## 2013-02-07 ENCOUNTER — Encounter (HOSPITAL_COMMUNITY): Payer: Self-pay | Admitting: Emergency Medicine

## 2013-02-07 ENCOUNTER — Emergency Department (HOSPITAL_COMMUNITY)
Admission: EM | Admit: 2013-02-07 | Discharge: 2013-02-08 | Disposition: A | Discharge status: Home / Self Care | Payer: PRIVATE HEALTH INSURANCE | Attending: Emergency Medicine | Admitting: Emergency Medicine

## 2013-02-07 DIAGNOSIS — Z3201 Encounter for pregnancy test, result positive: Secondary | ICD-10-CM

## 2013-02-07 DIAGNOSIS — D509 Iron deficiency anemia, unspecified: Secondary | ICD-10-CM | POA: Insufficient documentation

## 2013-02-07 DIAGNOSIS — R109 Unspecified abdominal pain: Secondary | ICD-10-CM | POA: Insufficient documentation

## 2013-02-07 DIAGNOSIS — N898 Other specified noninflammatory disorders of vagina: Secondary | ICD-10-CM | POA: Insufficient documentation

## 2013-02-07 DIAGNOSIS — Z79899 Other long term (current) drug therapy: Secondary | ICD-10-CM | POA: Insufficient documentation

## 2013-02-07 DIAGNOSIS — N949 Unspecified condition associated with female genital organs and menstrual cycle: Secondary | ICD-10-CM | POA: Insufficient documentation

## 2013-02-07 DIAGNOSIS — N644 Mastodynia: Secondary | ICD-10-CM | POA: Insufficient documentation

## 2013-02-07 LAB — URINALYSIS, ROUTINE W REFLEX MICROSCOPIC
Bilirubin Urine: NEGATIVE
GLUCOSE, UA: NEGATIVE mg/dL
HGB URINE DIPSTICK: NEGATIVE
Ketones, ur: NEGATIVE mg/dL
Leukocytes, UA: NEGATIVE
Nitrite: NEGATIVE
PH: 6.5 (ref 5.0–8.0)
Protein, ur: NEGATIVE mg/dL
SPECIFIC GRAVITY, URINE: 1.024 (ref 1.005–1.030)
Urobilinogen, UA: 0.2 mg/dL (ref 0.0–1.0)

## 2013-02-07 LAB — POCT I-STAT, CHEM 8
BUN: 10 mg/dL (ref 6–23)
CALCIUM ION: 1.36 mmol/L — AB (ref 1.12–1.23)
CHLORIDE: 100 meq/L (ref 96–112)
Creatinine, Ser: 0.8 mg/dL (ref 0.50–1.10)
GLUCOSE: 96 mg/dL (ref 70–99)
HEMATOCRIT: 39 % (ref 36.0–46.0)
Hemoglobin: 13.3 g/dL (ref 12.0–15.0)
Potassium: 3.8 mEq/L (ref 3.7–5.3)
Sodium: 137 mEq/L (ref 137–147)
TCO2: 26 mmol/L (ref 0–100)

## 2013-02-07 LAB — HCG, QUANTITATIVE, PREGNANCY: hCG, Beta Chain, Quant, S: 432 m[IU]/mL — ABNORMAL HIGH (ref <5)

## 2013-02-07 LAB — CBC WITH DIFFERENTIAL/PLATELET
Basophils Absolute: 0 10*3/uL (ref 0.0–0.1)
Basophils Relative: 0 % (ref 0–1)
Eosinophils Absolute: 0 10*3/uL (ref 0.0–0.7)
Eosinophils Relative: 1 % (ref 0–5)
HCT: 35.7 % — ABNORMAL LOW (ref 36.0–46.0)
HEMOGLOBIN: 11.8 g/dL — AB (ref 12.0–15.0)
LYMPHS ABS: 1.6 10*3/uL (ref 0.7–4.0)
LYMPHS PCT: 35 % (ref 12–46)
MCH: 28 pg (ref 26.0–34.0)
MCHC: 33.1 g/dL (ref 30.0–36.0)
MCV: 84.6 fL (ref 78.0–100.0)
MONOS PCT: 10 % (ref 3–12)
Monocytes Absolute: 0.4 10*3/uL (ref 0.1–1.0)
NEUTROS ABS: 2.4 10*3/uL (ref 1.7–7.7)
NEUTROS PCT: 54 % (ref 43–77)
Platelets: 177 10*3/uL (ref 150–400)
RBC: 4.22 MIL/uL (ref 3.87–5.11)
RDW: 13.7 % (ref 11.5–15.5)
WBC: 4.5 10*3/uL (ref 4.0–10.5)

## 2013-02-07 LAB — WET PREP, GENITAL
CLUE CELLS WET PREP: NONE SEEN
TRICH WET PREP: NONE SEEN

## 2013-02-07 LAB — POCT PREGNANCY, URINE: Preg Test, Ur: POSITIVE — AB

## 2013-02-07 LAB — ABO/RH: ABO/RH(D): A POS

## 2013-02-07 NOTE — ED Notes (Signed)
Patient with pelvic pain for the last 5 days.  Patient denies any nausea or vomiting.  Patient also states that she is also having bilateral breast pain.  Patient states she does not think she could be pregnant.

## 2013-02-07 NOTE — ED Notes (Signed)
Pt friend brought to POD A room 9, given a cup of coca cola.

## 2013-02-07 NOTE — ED Notes (Signed)
U/s at room to take pt.  When done will return pt to room A8.  Melanie made aware.

## 2013-02-07 NOTE — ED Provider Notes (Signed)
CSN: [id number]     Arrival date & time 02/07/13  1924 History   First MD Initiated Contact with Patient 02/07/13 2026     Chief Complaint  Patient presents with  . Abdominal Pain   (Consider location/radiation/quality/duration/timing/severity/associated sxs/prior Treatment) HPI Comments: Patient is a 23 year old G55P0 female who presents for suprapubic abdominal pain x3 days. Patient states that symptoms have been gradually worsening since onset and have been aching in nature with intermittent sharp pains sensations. Pain is nonradiating and without modifying factors. She has not taken anything for her symptoms. She endorses associated bilateral breast tenderness which she describes to be sharp. She states her last menstrual period was "sometime in December" and she endorses irregular menses. Patient states that she is sexually active and uses condoms. She denies breakage of any condoms recently; she does not think she could be pregnant. Patient further denies associated fever, chest pain or shortness of breath, nipple d/c or bleeding, nausea or vomiting, diarrhea, melena or hematochezia, dysuria or hematuria, vaginal bleeding or discharge, numbness/tingling, and weakness.  Patient is a 23 y.o. female presenting with abdominal pain. The history is provided by the patient. No language interpreter was used.  Abdominal Pain Associated symptoms: no diarrhea, no fever, no nausea and no vomiting     Past Medical History  Diagnosis Date  . Iron deficiency   . Headache   . Anemia    History reviewed. No pertinent past surgical history. No family history on file. History  Substance Use Topics  . Smoking status: Never Smoker   . Smokeless tobacco: Not on file  . Alcohol Use: No   OB History   Grav Para Term Preterm Abortions TAB SAB Ect Mult Living                 Review of Systems  Constitutional: Negative for fever.  Gastrointestinal: Positive for abdominal pain. Negative for nausea,  vomiting, diarrhea and blood in stool.  Musculoskeletal:       +b/l breast tenderness  All other systems reviewed and are negative.    Allergies  Review of patient's allergies indicates no known allergies.  Home Medications   Current Outpatient Rx  Name  Route  Sig  Dispense  Refill  . ferrous sulfate 325 (65 FE) MG tablet   Oral   Take 325 mg by mouth daily with breakfast.          BP 120/57  Pulse 87  Temp(Src) 98.7 F (37.1 C) (Oral)  Resp 18  Wt 147 lb (66.679 kg)  SpO2 100%  LMP 01/07/2013  Physical Exam  Nursing note and vitals reviewed. Constitutional: She is oriented to person, place, and time. She appears well-developed and well-nourished. No distress.  Patient is in no visible or audible discomfort in exam room bed  HENT:  Head: Normocephalic and atraumatic.  Eyes: Conjunctivae and EOM are normal. Pupils are equal, round, and reactive to light. No scleral icterus.  Neck: Normal range of motion. Neck supple.  Cardiovascular: Normal rate, regular rhythm, normal heart sounds and intact distal pulses.   Pulmonary/Chest: Effort normal and breath sounds normal. No respiratory distress. She has no wheezes. She has no rales.  Abdominal: Soft. She exhibits no distension and no mass. There is tenderness (mild, suprapubic). There is no rebound and no guarding.  No peritoneal signs or evidence of acute surgical abdomen  Genitourinary: There is no rash, tenderness, lesion or injury on the right labia. There is no rash, tenderness, lesion or injury  on the left labia. Uterus is tender (mild). Cervix exhibits no motion tenderness, no discharge and no friability. Right adnexum displays no mass, no tenderness and no fullness. Left adnexum displays no mass, no tenderness and no fullness. No erythema, tenderness or bleeding around the vagina. No signs of injury around the vagina. Vaginal discharge (milky) found.  Musculoskeletal: Normal range of motion.  Neurological: She is alert  and oriented to person, place, and time.  GCS 15. Patient moves extremities without ataxia  Skin: Skin is warm and dry. No rash noted. She is not diaphoretic. No erythema. No pallor.  Psychiatric: She has a normal mood and affect. Her behavior is normal.    ED Course  Procedures (including critical care time) Labs Review Labs Reviewed  WET PREP, GENITAL - Abnormal; Notable for the following:    Yeast Wet Prep HPF POC FEW (*)    WBC, Wet Prep HPF POC FEW (*)    All other components within normal limits  CBC WITH DIFFERENTIAL - Abnormal; Notable for the following:    Hemoglobin 11.8 (*)    HCT 35.7 (*)    All other components within normal limits  HCG, QUANTITATIVE, PREGNANCY - Abnormal; Notable for the following:    hCG, Beta Chain, Quant, S 432 (*)    All other components within normal limits  POCT PREGNANCY, URINE - Abnormal; Notable for the following:    Preg Test, Ur POSITIVE (*)    All other components within normal limits  POCT I-STAT, CHEM 8 - Abnormal; Notable for the following:    Calcium, Ion 1.36 (*)    All other components within normal limits  GC/CHLAMYDIA PROBE AMP  URINALYSIS, ROUTINE W REFLEX MICROSCOPIC  ABO/RH   Imaging Review [place] Ob Comp Less 14 Wks  02/08/2013   CLINICAL DATA:  Abdominal pain, pregnant, beta HCG 432  EXAM: OBSTETRIC <14 WK US AND TRANSVAGINAL OB US  TECHNIQUE: Both transabdominal and transvaginal ultrasound examinations were performed for complete evaluation of the gestation as well as the maternal uterus, adnexal regions, and pelvic cul-de-sac. Transvaginal technique was performed to assess early pregnancy.  COMPARISON:  None.  FINDINGS: Intrauterine gestational sac: Not visualized.  Maternal uterus/adnexae: Endometrial complex measures 16 mm.  Left ovary is within normal limits, measuring 1.6 x 3.9 x 1.6 cm.  Right ovary measures 2.6 x 4.8 x 2.8 cm and is notable for a 2.2 cm corpus luteal cyst.  Trace pelvic ascites.  IMPRESSION: No IUP is  visualized. This is not unexpected given the low beta HCG. However, by definition, this reflects a pregnancy of unknown location.  Primary differential considerations include early IUP, abnormal IUP, or nonvisualized ectopic pregnancy.  Serial beta HCG is suggested, supplemented by repeat pelvic sonography as clinically warranted.   Electronically Signed   By: Charline BillsSriyesh  Krishnan M.D.   On: 02/08/2013 00:24   [place] Ob Transvaginal  02/08/2013   CLINICAL DATA:  Abdominal pain, pregnant, beta HCG 432  EXAM: OBSTETRIC <14 WK US AND TRANSVAGINAL OB US  TECHNIQUE: Both transabdominal and transvaginal ultrasound examinations were performed for complete evaluation of the gestation as well as the maternal uterus, adnexal regions, and pelvic cul-de-sac. Transvaginal technique was performed to assess early pregnancy.  COMPARISON:  None.  FINDINGS: Intrauterine gestational sac: Not visualized.  Maternal uterus/adnexae: Endometrial complex measures 16 mm.  Left ovary is within normal limits, measuring 1.6 x 3.9 x 1.6 cm.  Right ovary measures 2.6 x 4.8 x 2.8 cm and is notable for a  2.2 cm corpus luteal cyst.  Trace pelvic ascites.  IMPRESSION: No IUP is visualized. This is not unexpected given the low beta HCG. However, by definition, this reflects a pregnancy of unknown location.  Primary differential considerations include early IUP, abnormal IUP, or nonvisualized ectopic pregnancy.  Serial beta HCG is suggested, supplemented by repeat pelvic sonography as clinically warranted.   Electronically Signed   By: Charline Bills M.D.   On: 02/08/2013 00:24    EKG Interpretation   None       MDM   1. Positive pregnancy test   2. Abdominal pain in female patient     [age] - 76 year old G62P0 female presents today for suprapubic abdominal pain x3 days. Symptoms associated with bilateral breast tenderness. Heart RRR, lungs CTAB, and abdomen soft and nondistended with minimal tenderness in the suprapubic region. Urine  pregnancy test today is positive. Patient has no leukocytosis, marked anemia, or electrolyte imbalance. Kidney function preserved and urinalysis does not suggest infection. Will further evaluate with OB ultrasound, hCG, ABO/Rh, as well as GC/chlamydia and wet prep. Patient denies any vaginal bleeding or discharge. She is afebrile and hemodynamically stable.  0030 - hCG 432 c/w pregnancy of 2-[redacted] weeks gestation. Pelvic U/S without any complicating features, therefore, unable to determine normal early IUP, abnormal IUP, or ectopic pregnancy. Patient hemodynamically stable and afebrile throughout ED course without any worsening abdominal pain; she has not required any pain medicine for management of her symptoms. No N/V. I have discussed U/S findings in their entirety with the patient who verbalizes understanding. I recommend that she followup with women's outpatient clinic or an OB/GYN for prenatal care and further evaluation of her symptoms. Have also advised repeat pelvic ultrasound as an outpatient should symptoms persist. Tylenol advised for pain control. Return precautions discussed with the patient and resource guide provided. Patient agreeable to plan with no unaddressed concerns. She is stable for d/c.   Filed Vitals:   02/07/13 2230 02/07/13 2245 02/07/13 2300 02/08/13 0120  BP: 108/48 121/64 120/57 121/54  Pulse: 85 85 87 73  Temp:      TempSrc:      Resp:    18  Weight:      SpO2: 100% 100% 100% 100%     Antony Madura, PA-C 02/08/13 0203

## 2013-02-08 NOTE — ED Notes (Signed)
Pt A&Ox4, expressed understanding of discharge instructions and the importance of following up with womens outpatient clinic.

## 2013-02-08 NOTE — Discharge Instructions (Signed)
It is estimated that you are approximately 2-[redacted] weeks pregnant. Your pelvic ultrasound today does not show any concerning or emergent process; however, it is too early to determine whether your pain is secondary to an ectopic pregnancy or other pregnancy complication. Recommend followup with women's outpatient clinic for prenatal care and further evaluation of your symptoms. Recommend you take 650 mg Tylenol every 6 hours for pain control as needed. Return to the emergency department if symptoms worsen or if you begin to experience any of the symptoms listed below.  Abdominal Pain During Pregnancy Abdominal pain is common in pregnancy. Most of the time, it does not cause harm. There are many causes of abdominal pain. Some causes are more serious than others. Some of the causes of abdominal pain in pregnancy are easily diagnosed. Occasionally, the diagnosis takes time to understand. Other times, the cause is not determined. Abdominal pain can be a sign that something is very wrong with the pregnancy, or the pain may have nothing to do with the pregnancy at all. For this reason, always tell your health care provider if you have any abdominal discomfort. HOME CARE INSTRUCTIONS  Monitor your abdominal pain for any changes. The following actions may help to alleviate any discomfort you are experiencing:  Do not have sexual intercourse or put anything in your vagina until your symptoms go away completely.  Get plenty of rest until your pain improves.  Drink clear fluids if you feel nauseous. Avoid solid food as long as you are uncomfortable or nauseous.  Only take over-the-counter or prescription medicine as directed by your health care provider.  Keep all follow-up appointments with your health care provider. SEEK IMMEDIATE MEDICAL CARE IF:  You are bleeding, leaking fluid, or passing tissue from the vagina.  You have increasing pain or cramping.  You have persistent vomiting.  You have painful or  bloody urination.  You have a fever.  You notice a decrease in your baby's movements.  You have extreme weakness or feel faint.  You have shortness of breath, with or without abdominal pain.  You develop a severe headache with abdominal pain.  You have abnormal vaginal discharge with abdominal pain.  You have persistent diarrhea.  You have abdominal pain that continues even after rest, or gets worse. MAKE SURE YOU:   Understand these instructions.  Will watch your condition.  Will get help right away if you are not doing well or get worse. Document Released: 01/08/2005 Document Revised: 10/29/2012 Document Reviewed: 08/07/2012 [facility] Patient Information 2014 [place]Care, [place]LL[place].  Emergency Department Resource Guide 1) Find a Doctor and Pay Out of Pocket Although you won't have to find out who is covered by your insurance plan, it is a good idea to ask around and get recommendations. You will then need to call the office and see if the doctor you have chosen will accept you as a new patient and what types of options they offer for patients who are self-pay. Some doctors offer discounts or will set up payment plans for their patients who do not have insurance, but you will need to ask so you aren't surprised when you get to your appointment.  2) Contact Your Local Health Department Not all health departments have doctors that can see patients for sick visits, but many do, so it is worth a call to see if yours does. If you don't know where your local health department is, you can check in your phone book. The CDC also has a tool  to help you locate your state's health department, and many state websites also have listings of all of their local health departments.  3) Find a Walk-in Clinic If your illness is not likely to be very severe or complicated, you may want to try a walk in clinic. These are popping up all over the country in pharmacies, drugstores, and shopping centers. They're  usually staffed by nurse practitioners or physician assistants that have been trained to treat common illnesses and complaints. They're usually fairly quick and inexpensive. However, if you have serious medical issues or chronic medical problems, these are probably not your best option.  No Primary Care Doctor: - Call Health Connect at  [phone] - they can help you locate a primary care doctor that  accepts your insurance, provides certain services, etc. - Physician Referral Service- [phone]  Chronic Pain Problems: Organization         Address  Phone   Notes  [facility]  [phone] Patients need to be referred by their primary care doctor.   Medication Assistance: Organization         Address  Phone   Notes  [facility] Medication [facility] [address] [place], [place] [address] [phone] --Must be a resident of [facility] -- Must have NO insurance coverage whatsoever (no Medicaid/ Medicare, etc.) -- The pt. MUST have a primary care doctor that directs their care regularly and follows them in the community   [facility]  [phone]   [employer]  [phone]    Agencies that provide inexpensive medical care: Organization         Address  Phone   Notes  [facility]  [phone]   [facility]    [phone]   [facility] [address] [place], [place] [address] [phone]   [facility] [address] [place], [place] [phone]   Planned Parenthood    [phone]   [facility]    [phone]   [facility]  [address], [place] Phone:  [phone], Fax:  [phone] Hours of Operation:  9 am - 6 pm, M-F.  Also accepts Medicaid/Medicare and self-pay.  [facility]  [address], [place] Phone: [phone], Fax: [phone]. Hours  of Operation:  8:30 am - 5:30 pm, M-F.  Also accepts Medicaid and self-pay.  [facility] [place] [address] [place] Phone: [phone]   [facility] [address] Natasha Bence [place], [place] [phone], Ext. [phone] Mondays & Thursdays: 7-9 AM.  First 15 patients are seen on a first come, first serve basis.    Medicaid-accepting [facility] Providers:  Organization         Address  Phone   Notes  [facility] [address], [place] [phone] Also accepts self-pay patients.  [facility] [address] Laurell Josephs [place], [place]  [phone]   [facility] [address], [place] [phone]   [facility] [address] [place] [phone]   Renaye Rakers [address], [place]   [phone] Only accepts [place] Access [place] patients after they have their name applied to their card.   Self-Pay (no insurance) in [place]:  Organization         Address  Phone   Notes  Sickle Cell Patients, [facility] [address] [place], [place] [phone]   [facility] [address] [place], [place] [phone]   [facility] [place]  [address], [place] [phone]   [facility]/Dr. Osei-Bonsu  [address] [place] or [address], [place] [phone] Phone number for both [place] and [place] locations is the same.  [facility] [address] [place] [phone]   [facility] [address], [place] or [address] [phone]   [facility] [address], [place] [phone], phone; [phone], fax Sees patients 1st and 3rd Saturday of every month.  Must not qualify for public or private insurance (i.e. Medicaid,  Medicare, [place] Health Choice, Veterans' Benefits)  Household income should be no more than 200% of the poverty level The clinic cannot treat you if you are pregnant or think you are pregnant  Sexually transmitted diseases are not treated at the clinic.    Dental Care: Organization         Address  Phone  Notes  [facility] [address] [place], [place] [phone] Accepts children up to age 65 who are enrolled in [place] or [place] Health Choice; pregnant women with a Medicaid card; and children who have applied for Medicaid or [place] Health Choice, but were declined, whose parents can pay a reduced fee at time of service.  [facility]  [address], [place] [phone] Accepts children up to age 63 who are enrolled in [place] or [place] Health Choice; pregnant women with a Medicaid card; and children who have applied for Medicaid or [place] Health Choice, but were declined, whose parents can pay a reduced fee at time of service.  [facility]  [address] [place], [place] [phone] Patients are seen by appointment only. Walk-ins are not accepted. [facility] will see patients 99 years of age and older. Monday - Tuesday (8am-5pm) Most Wednesdays (8:30-5pm) $30 per visit, cash only  [facility] Adult Dental Access PRO[facility]  [address], [facility] [phone] Patients are seen by appointment only. Walk-ins are not accepted. [facility] will see patients 71 years of age and older. One Wednesday Evening (Monthly: Volunteer Based).  $30 per visit, cash only  [employer]  [phone] for adults; Children under age 62, call Graduate Pediatric Dentistry at [phone]. Children aged 92-14, please call [phone] to request a pediatric application.  Dental services are provided in all areas of dental care including fillings, crowns  and bridges, complete and partial dentures, implants, gum treatment, root canals, and extractions. Preventive care is also provided. Treatment is provided to both adults and children. Patients are selected via a lottery and there is often a waiting list.   [facility] [address], [place]  [phone] [email or web address]   [facility] [address] [place], [place] [phone], Ext. [phone] Second and Fourth Thursday of each month, opens at 6:30 AM; Clinic ends at 9 AM.  Patients are seen on a first-come first-served basis, and a limited number are seen during each clinic.  [facility]  [address] [place], [place] [phone]   Eligibility Requirements You must have lived in [place], [place], or [place] counties for at least the last three months.   You cannot be eligible for state or federal sponsored [place], including [employer], [place], or [employer].   You generally cannot be eligible for healthcare insurance through your employer.    How to apply: Eligibility screenings are held every Tuesday and Wednesday afternoon from 1:00 pm until 4:00 pm. You do not need an appointment for the interview!  [facility] [address] [place], [place] [phone]   [facility]  [phone]   [facility]  [phone]   [facility] Health Department  [phone]    Behavioral Health Resources in the Community: Intensive Outpatient Programs Organization         Address  Phone  Notes  [facility] [address] [place], [place] [phone]   [facility] [address], [place], [place] [phone]   [facility] [address], [place], [place]  [phone]   [facility] [address]  [place], [place] [phone] or [phone]   Substance Abuse  Resources Organization         Address  Phone  Notes  [facility] Drug Services  [phone]   [facility]  [phone]   [facility]  [phone]   Floydene Flock  [phone]   Residential & Outpatient Substance Abuse Program  [phone]   Psychological Services Organization         Address  Phone  Notes  [facility]  [phone]   [facility]  [phone]   [facility] [address] [place] [phone] or [phone]    Mobile Crisis Teams Organization         Address  Phone  Notes  [facility], Mobile Crisis Care Unit  [phone]   [facility]  [address] [place], [place] [phone]   Doristine Locks [address] [place] [phone]    Self-Help/Support Groups Organization         Address  Phone             Notes  [facility] [place] - variety of support groups  [phone]- [phone] Call for more information  Narcotics Anonymous (NA), [facility] [address], [employer] [place]  2 meetings at this location   Statistician         Address  Phone  Notes  [facility] [address],    [place]  [phone]   [facility]  [address] [place] [address], [place], [place] [phone]   [facility] [address] [place], [place] [phone] Admissions: 8am-3pm M-F  [facility] [address],    [place], [place] [phone]   [facility] [address] Starling Manns [place], [place] [phone]   [facility] [address],  [place], [place] [phone]   [facility] - Intensive Outpatient [address]ure[address] Joseph[address], [place], [place] [phone]   [facility] ([facility] [address] [place]  [place], [place] [phone] or [phone]   [facility] ([facility]) [address], [place], [place] [phone] Accepts Medicaid  [facility] [address],  [place] [phone] Substance Abuse/Addiction  [place] Resources Organization         Address  Phone  Notes  [facility]  [phone]   Domenic Schwab, PhD [address] Arlis Porta [place], [place]   [phone] or [phone]   [facility] [place], [place] [phone]   [address], [place], [place] [phone] Insurance/Medicaid/sponsorship through [facility] and Families [address] [place]                                    [place], [place] [phone] [place] [address] [place], [place] [phone]    Dr. Adele Schilder  [phone]   [facility] Dept. 1) [address], [place] 2) [place] 3)  [address], [place] [phone]  [phone]   [facility] [phone] or [phone] (After Hours)

## 2013-02-09 LAB — GC/CHLAMYDIA PROBE AMP
CT Probe RNA: NEGATIVE
GC PROBE AMP APTIMA: NEGATIVE

## 2013-02-11 NOTE — ED Provider Notes (Signed)
Medical screening examination/treatment/procedure(s) were performed by non-physician practitioner and as supervising physician I was immediately available for consultation/collaboration.    Nelia Shiobert L Bunny Lowdermilk, MD 02/11/13 [phone]

## 2013-04-01 ENCOUNTER — Emergency Department (HOSPITAL_BASED_OUTPATIENT_CLINIC_OR_DEPARTMENT_OTHER)
Admission: EM | Admit: 2013-04-01 | Discharge: 2013-04-01 | Disposition: A | Discharge status: Home / Self Care | Payer: Medicaid Other | Attending: Emergency Medicine | Admitting: Emergency Medicine

## 2013-04-01 ENCOUNTER — Encounter (HOSPITAL_BASED_OUTPATIENT_CLINIC_OR_DEPARTMENT_OTHER): Payer: Self-pay | Admitting: Emergency Medicine

## 2013-04-01 ENCOUNTER — Emergency Department (HOSPITAL_BASED_OUTPATIENT_CLINIC_OR_DEPARTMENT_OTHER): Payer: Medicaid Other

## 2013-04-01 DIAGNOSIS — R109 Unspecified abdominal pain: Secondary | ICD-10-CM | POA: Insufficient documentation

## 2013-04-01 DIAGNOSIS — D509 Iron deficiency anemia, unspecified: Secondary | ICD-10-CM | POA: Insufficient documentation

## 2013-04-01 DIAGNOSIS — Z3202 Encounter for pregnancy test, result negative: Secondary | ICD-10-CM | POA: Insufficient documentation

## 2013-04-01 DIAGNOSIS — Z79899 Other long term (current) drug therapy: Secondary | ICD-10-CM | POA: Insufficient documentation

## 2013-04-01 DIAGNOSIS — N898 Other specified noninflammatory disorders of vagina: Secondary | ICD-10-CM

## 2013-04-01 LAB — URINALYSIS, ROUTINE W REFLEX MICROSCOPIC
Bilirubin Urine: NEGATIVE
Glucose, UA: NEGATIVE mg/dL
Hgb urine dipstick: NEGATIVE
Ketones, ur: NEGATIVE mg/dL
Leukocytes, UA: NEGATIVE
Nitrite: NEGATIVE
PROTEIN: NEGATIVE mg/dL
Specific Gravity, Urine: 1.034 — ABNORMAL HIGH (ref 1.005–1.030)
UROBILINOGEN UA: 1 mg/dL (ref 0.0–1.0)
pH: 6 (ref 5.0–8.0)

## 2013-04-01 LAB — WET PREP, GENITAL
Clue Cells Wet Prep HPF POC: NONE SEEN
TRICH WET PREP: NONE SEEN
Yeast Wet Prep HPF POC: NONE SEEN

## 2013-04-01 LAB — PREGNANCY, URINE: Preg Test, Ur: NEGATIVE

## 2013-04-01 NOTE — Discharge Instructions (Signed)
Follow up with your doctor or the health department for continued symptoms. they will call you in the next 3 days if you have an std. Abdominal Pain, Adult Many things can cause abdominal pain. Usually, abdominal pain is not caused by a disease and will improve without treatment. It can often be observed and treated at home. Your health care provider will do a physical exam and possibly order blood tests and X-rays to help determine the seriousness of your pain. However, in many cases, more time must pass before a clear cause of the pain can be found. Before that point, your health care provider may not know if you need more testing or further treatment. HOME CARE INSTRUCTIONS  Monitor your abdominal pain for any changes. The following actions may help to alleviate any discomfort you are experiencing:  Only take over-the-counter or prescription medicines as directed by your health care provider.  Do not take laxatives unless directed to do so by your health care provider.  Try a clear liquid diet (broth, tea, or water) as directed by your health care provider. Slowly move to a bland diet as tolerated. SEEK MEDICAL CARE IF:  You have unexplained abdominal pain.  You have abdominal pain associated with nausea or diarrhea.  You have pain when you urinate or have a bowel movement.  You experience abdominal pain that wakes you in the night.  You have abdominal pain that is worsened or improved by eating food.  You have abdominal pain that is worsened with eating fatty foods. SEEK IMMEDIATE MEDICAL CARE IF:   Your pain does not go away within 2 hours.  You have a fever.  You keep throwing up (vomiting).  Your pain is felt only in portions of the abdomen, such as the right side or the left lower portion of the abdomen.  You pass bloody or black tarry stools. MAKE SURE YOU:  Understand these instructions.   Will watch your condition.   Will get help right away if you are not doing  well or get worse.  Document Released: 10/18/2004 Document Revised: 10/29/2012 Document Reviewed: 09/17/2012 [facility] Patient Information 2014 [place] VillageExitCare, [place]LL[place].

## 2013-04-01 NOTE — ED Provider Notes (Signed)
CSN: [id number]     Arrival date & time 04/01/13  1316 History   First MD Initiated Contact with Patient 04/01/13 1317     Chief Complaint  Patient presents with  . Abdominal Pain     (Consider location/radiation/quality/duration/timing/severity/associated sxs/prior Treatment) HPI Comments: Pt states that she has been having suprapubic abdominal pain and vaginal discharge for the last week.denies nausea, vomiting, diarrhea or fever. States that she has surgery for an ectopic 2 months ago. Discharge is white is color  The history is provided by the patient. No language interpreter was used.    Past Medical History  Diagnosis Date  . Iron deficiency   . Headache   . Anemia    Past Surgical History  Procedure Laterality Date  . Eye surgery     No family history on file. History  Substance Use Topics  . Smoking status: Never Smoker   . Smokeless tobacco: Never Used  . Alcohol Use: No   OB History   Grav Para Term Preterm Abortions TAB SAB Ect Mult Living                 Review of Systems  Constitutional: Negative.   Respiratory: Negative.   Cardiovascular: Negative.       Allergies  Review of patient's allergies indicates no known allergies.  Home Medications   Current Outpatient Rx  Name  Route  Sig  Dispense  Refill  . ferrous sulfate 325 (65 FE) MG tablet   Oral   Take 325 mg by mouth daily with breakfast.          BP 107/56  Pulse 70  Temp(Src) 98.5 F (36.9 C) (Oral)  Resp 18  Ht 5\' 5"  (1.651 m)  Wt 145 lb (65.772 kg)  BMI 24.13 kg/m2  SpO2 100%  LMP 03/26/2013 Physical Exam  Nursing note and vitals reviewed. Constitutional: She is oriented to person, place, and time. She appears well-developed and well-nourished.  Cardiovascular: Normal rate and regular rhythm.   Pulmonary/Chest: Effort normal and breath sounds normal.  Abdominal: Soft. Bowel sounds are normal. There is no tenderness.  Genitourinary:  Thin white discharge. No cmt. No  adnexal tenderness  Musculoskeletal: Normal range of motion.  Neurological: She is alert and oriented to person, place, and time.  Skin: Skin is warm and dry.  Psychiatric: She has a normal mood and affect.    ED Course  Procedures (including critical care time) Labs Review Labs Reviewed  WET PREP, GENITAL - Abnormal; Notable for the following:    WBC, Wet Prep HPF POC FEW (*)    All other components within normal limits  URINALYSIS, ROUTINE W REFLEX MICROSCOPIC - Abnormal; Notable for the following:    Specific Gravity, Urine 1.034 (*)    All other components within normal limits  GC/CHLAMYDIA PROBE AMP  PREGNANCY, URINE   Imaging Review [place] Transvaginal Non-ob  04/01/2013   CLINICAL DATA Pelvic pain. Recent ectopic pregnancy January 2015. Negative pregnancy test currently.  EXAM TRANSABDOMINAL AND TRANSVAGINAL ULTRASOUND OF PELVIS  TECHNIQUE Both transabdominal and transvaginal ultrasound examinations of the pelvis were performed. Transabdominal technique was performed for global imaging of the pelvis including uterus, ovaries, adnexal regions, and pelvic cul-de-sac. It was necessary to proceed with endovaginal exam following the transabdominal exam to visualize the endometrium and adnexa.  COMPARISON None  FINDINGS Uterus  Measurements: 8 x 4 x 5 cm. No fibroids or other mass visualized.  Endometrium  Thickness: 2 cm, at the upper limits  of normal. No focal abnormality visualized.  Right ovary  Measurements: 3.7 x 2.6 x 2.4 cm. Normal appearance/no adnexal mass.  Left ovary  Measurements: 4.1 x 1.5 x 2.3 cm. Normal appearance/no adnexal mass.  Other findings  Trace, simple appearing free pelvic fluid.  IMPRESSION Negative pelvic ultrasound.  SIGNATURE  Electronically Signed   By: Tiburcio Pea M.D.   On: 04/01/2013 15:08   US Pelvis Complete  04/01/2013   CLINICAL DATA Pelvic pain. Recent ectopic pregnancy January 2015. Negative pregnancy test currently.  EXAM TRANSABDOMINAL AND  TRANSVAGINAL ULTRASOUND OF PELVIS  TECHNIQUE Both transabdominal and transvaginal ultrasound examinations of the pelvis were performed. Transabdominal technique was performed for global imaging of the pelvis including uterus, ovaries, adnexal regions, and pelvic cul-de-sac. It was necessary to proceed with endovaginal exam following the transabdominal exam to visualize the endometrium and adnexa.  COMPARISON None  FINDINGS Uterus  Measurements: 8 x 4 x 5 cm. No fibroids or other mass visualized.  Endometrium  Thickness: 2 cm, at the upper limits of normal. No focal abnormality visualized.  Right ovary  Measurements: 3.7 x 2.6 x 2.4 cm. Normal appearance/no adnexal mass.  Left ovary  Measurements: 4.1 x 1.5 x 2.3 cm. Normal appearance/no adnexal mass.  Other findings  Trace, simple appearing free pelvic fluid.  IMPRESSION Negative pelvic ultrasound.  SIGNATURE  Electronically Signed   By: Tiburcio Pea M.D.   On: 04/01/2013 15:08     EKG Interpretation None      MDM   Final diagnoses:  Abdominal pain  Vaginal discharge    Abdominal exam benign. Std cultures sent.no sign of abnormality on the [place]. Pt is not pregnant. Urine not infected    Teressa Lower, NP 04/01/13 1549

## 2013-04-01 NOTE — ED Notes (Signed)
Lower stomach pain x 1 week- comes and goes- +vag d/c

## 2013-04-02 LAB — GC/CHLAMYDIA PROBE AMP
CT Probe RNA: NEGATIVE
GC Probe RNA: NEGATIVE

## 2013-04-02 NOTE — ED Provider Notes (Signed)
Medical screening examination/treatment/procedure(s) were performed by non-physician practitioner and as supervising physician I was immediately available for consultation/collaboration.   EKG Interpretation None        Kristen N Ward, DO 04/02/13 0728 

## 2013-05-10 ENCOUNTER — Emergency Department (HOSPITAL_BASED_OUTPATIENT_CLINIC_OR_DEPARTMENT_OTHER)
Admission: EM | Admit: 2013-05-10 | Discharge: 2013-05-10 | Disposition: A | Discharge status: Home / Self Care | Payer: Medicaid Other | Attending: Emergency Medicine | Admitting: Emergency Medicine

## 2013-05-10 DIAGNOSIS — Z9889 Other specified postprocedural states: Secondary | ICD-10-CM | POA: Insufficient documentation

## 2013-05-10 DIAGNOSIS — H5789 Other specified disorders of eye and adnexa: Secondary | ICD-10-CM | POA: Insufficient documentation

## 2013-05-10 DIAGNOSIS — Z79899 Other long term (current) drug therapy: Secondary | ICD-10-CM | POA: Insufficient documentation

## 2013-05-10 DIAGNOSIS — J329 Chronic sinusitis, unspecified: Secondary | ICD-10-CM | POA: Insufficient documentation

## 2013-05-10 DIAGNOSIS — D509 Iron deficiency anemia, unspecified: Secondary | ICD-10-CM | POA: Insufficient documentation

## 2013-05-10 MED ORDER — AMOXICILLIN-POT CLAVULANATE 875-125 MG PO TABS: 1.0000 | ORAL_TABLET | Freq: Two times a day (BID) | ORAL | Status: DC

## 2013-05-10 NOTE — ED Notes (Addendum)
Pt has red irritated eyes and headache x two weeks.  Some fever.  Pt c/o tenderness over sinuses.

## 2013-05-10 NOTE — Discharge Instructions (Signed)

## 2013-05-10 NOTE — ED Provider Notes (Addendum)
CSN: [id number]     Arrival date & time 05/10/13  1744 History  This chart was scribed for Hilario Quarryanielle S Chey Cho, MD by Dorothey Basemania Sutton, ED Scribe. This patient was seen in room MH01/MH01 and the patient's care was started at 6:31 PM.    Chief Complaint  Patient presents with  . Headache  . Eye Problem   Patient is a 23 y.o. female presenting with headaches and eye problem. The history is provided by the patient. No language interpreter was used.  Headache Pain location:  Generalized Quality:  Dull Radiates to:  Does not radiate Onset quality:  Gradual Timing:  Intermittent Progression:  Unchanged Chronicity:  Recurrent Similar to prior headaches: yes   Relieved by:  Nothing Worsened by:  Nothing tried Ineffective treatments: allergy medication. Associated symptoms: congestion   Associated symptoms: no fever   Congestion:    Location:  Nasal   Interferes with sleep: no     Interferes with eating/drinking: no   Eye Problem Location:  Both Severity:  Moderate Onset quality:  Gradual Timing:  Intermittent Progression:  Unchanged Chronicity:  New Relieved by:  Nothing Worsened by:  Nothing tried Ineffective treatments: allergy medication. Associated symptoms: discharge, headaches, itching, redness and swelling   Discharge:    Discharge characteristics: watery.  HPI Comments: Evelyn Sullivan is a 23 y.o. female who presents to the Emergency Department complaining of an intermittent itching to the bilateral eyes with associated periorbital swelling, eye redness, watery discharge, rhinorrhea, congestion, and a diffuse headache onset 3 weeks ago. Patient denies history of seasonal allergies. She reports taking allergy medication twice a day for the past 2 weeks without significant relief. She denies fever. Patient has a history of headaches and iron deficiency anemia. Patient reports that she is currently on her menstrual period. Patient does not smoke or drink.   Past Medical History   Diagnosis Date  . Iron deficiency   . Headache   . Anemia    Past Surgical History  Procedure Laterality Date  . Eye surgery     No family history on file. History  Substance Use Topics  . Smoking status: Never Smoker   . Smokeless tobacco: Never Used  . Alcohol Use: No   OB History   Grav Para Term Preterm Abortions TAB SAB Ect Mult Living                 Review of Systems  Constitutional: Negative for fever.  HENT: Positive for congestion and rhinorrhea.   Eyes: Positive for discharge, redness and itching.  Neurological: Positive for headaches.      Allergies  Review of patient's allergies indicates no known allergies.  Home Medications   Prior to Admission medications   Medication Sig Start Date End Date Taking? Authorizing Provider  ferrous sulfate 325 (65 FE) MG tablet Take 325 mg by mouth daily with breakfast.    Historical Provider, MD   Triage Vitals: BP 143/95  Pulse 75  Temp(Src) 98.7 F (37.1 C) (Oral)  Resp 16  Ht 5\' 6"  (1.676 m)  Wt 140 lb (63.504 kg)  BMI 22.61 kg/m2  SpO2 100%  LMP 05/10/2013  Physical Exam  Nursing note and vitals reviewed. Constitutional: She is oriented to person, place, and time. She appears well-developed and well-nourished. No distress.  HENT:  Head: Normocephalic and atraumatic.  Tenderness to palpation to bilateral frontal and maxillary sinuses.   Eyes: EOM are normal. Pupils are equal, round, and reactive to light. Right eye exhibits no  discharge. Left eye exhibits no discharge.  Injected conjunctiva bilaterally.   Neck: Normal range of motion. Neck supple.  Cardiovascular: Normal rate, regular rhythm and normal heart sounds.   Pulmonary/Chest: Effort normal and breath sounds normal. No respiratory distress.  Abdominal: She exhibits no distension.  Musculoskeletal: Normal range of motion.  Neurological: She is alert and oriented to person, place, and time.  Skin: Skin is warm and dry.  Psychiatric: She has a  normal mood and affect. Her behavior is normal.    ED Course  Procedures (including critical care time)  DIAGNOSTIC STUDIES: Oxygen Saturation is 100% on room air, normal by my interpretation.    COORDINATION OF CARE: 6:36 PM- Discussed that symptoms appear to be due to seasonal allergies. Will discharge patient with medication to treat sinusitis. Discussed treatment plan with patient at bedside and patient verbalized agreement.     Labs Review Labs Reviewed - No data to display  Imaging Review No results found.   EKG Interpretation None      MDM   Final diagnoses:  Sinusitis   I personally performed the services described in this documentation, which was scribed in my presence. The recorded information has been reviewed and considered.    Hilario Quarryanielle S Kanyon Bunn, MD 05/10/13 2003  Hilario Quarryanielle S Fard Borunda, MD 05/10/13 2004

## 2013-05-18 ENCOUNTER — Emergency Department (HOSPITAL_BASED_OUTPATIENT_CLINIC_OR_DEPARTMENT_OTHER)
Admission: EM | Admit: 2013-05-18 | Discharge: 2013-05-18 | Disposition: A | Discharge status: Home / Self Care | Payer: Medicaid Other | Attending: Emergency Medicine | Admitting: Emergency Medicine

## 2013-05-18 ENCOUNTER — Encounter (HOSPITAL_BASED_OUTPATIENT_CLINIC_OR_DEPARTMENT_OTHER): Payer: Self-pay | Admitting: Emergency Medicine

## 2013-05-18 DIAGNOSIS — J329 Chronic sinusitis, unspecified: Secondary | ICD-10-CM | POA: Insufficient documentation

## 2013-05-18 DIAGNOSIS — R040 Epistaxis: Secondary | ICD-10-CM | POA: Insufficient documentation

## 2013-05-18 DIAGNOSIS — D509 Iron deficiency anemia, unspecified: Secondary | ICD-10-CM | POA: Insufficient documentation

## 2013-05-18 DIAGNOSIS — J309 Allergic rhinitis, unspecified: Secondary | ICD-10-CM | POA: Insufficient documentation

## 2013-05-18 DIAGNOSIS — Z792 Long term (current) use of antibiotics: Secondary | ICD-10-CM | POA: Insufficient documentation

## 2013-05-18 DIAGNOSIS — Z79899 Other long term (current) drug therapy: Secondary | ICD-10-CM | POA: Insufficient documentation

## 2013-05-18 MED ORDER — FLUTICASONE PROPIONATE 50 MCG/ACT NA SUSP: 2.0000 | Freq: Every day | NASAL | Status: DC

## 2013-05-18 MED ORDER — PREDNISONE 10 MG PO TABS: ORAL_TABLET | ORAL | Status: DC

## 2013-05-18 MED ORDER — CETIRIZINE-PSEUDOEPHEDRINE ER 5-120 MG PO TB12: 1.0000 | ORAL_TABLET | Freq: Every day | ORAL | Status: DC

## 2013-05-18 NOTE — ED Notes (Signed)
States was treated for a sinus infection a week ago with Augmentin. No better. She is having nose bleeds and her skin is itching worse at night. Itching started before starting Augmentin.

## 2013-05-18 NOTE — Discharge Instructions (Signed)
Allergic Rhinitis °Allergic rhinitis is when the mucous membranes in the nose respond to allergens. Allergens are particles in the air that cause your body to have an allergic reaction. This causes you to release allergic antibodies. Through a chain of events, these eventually cause you to release histamine into the blood stream. Although meant to protect the body, it is this release of histamine that causes your discomfort, such as frequent sneezing, congestion, and an itchy, runny nose.  °CAUSES  °Seasonal allergic rhinitis (hay fever) is caused by pollen allergens that may come from grasses, trees, and weeds. Year-round allergic rhinitis (perennial allergic rhinitis) is caused by allergens such as house dust mites, pet dander, and mold spores.  °SYMPTOMS  °· Nasal stuffiness (congestion). °· Itchy, runny nose with sneezing and tearing of the eyes. °DIAGNOSIS  °Your health care provider can help you determine the allergen or allergens that trigger your symptoms. If you and your health care provider are unable to determine the allergen, skin or blood testing may be used. °TREATMENT  °Allergic Rhinitis does not have a cure, but it can be controlled by: °· Medicines and allergy shots (immunotherapy). °· Avoiding the allergen. °Hay fever may often be treated with antihistamines in pill or nasal spray forms. Antihistamines block the effects of histamine. There are over-the-counter medicines that may help with nasal congestion and swelling around the eyes. Check with your health care provider before taking or giving this medicine.  °If avoiding the allergen or the medicine prescribed do not work, there are many new medicines your health care provider can prescribe. Stronger medicine may be used if initial measures are ineffective. Desensitizing injections can be used if medicine and avoidance does not work. Desensitization is when a patient is given ongoing shots until the body becomes less sensitive to the allergen.  Make sure you follow up with your health care provider if problems continue. °HOME CARE INSTRUCTIONS °It is not possible to completely avoid allergens, but you can reduce your symptoms by taking steps to limit your exposure to them. It helps to know exactly what you are allergic to so that you can avoid your specific triggers. °SEEK MEDICAL CARE IF:  °· You have a fever. °· You develop a cough that does not stop easily (persistent). °· You have shortness of breath. °· You start wheezing. °· Symptoms interfere with normal daily activities. °Document Released: 10/03/2000 Document Revised: 10/29/2012 Document Reviewed: 09/15/2012 °[employer]® Patient Information ©2014 [employer]. ° °Sinusitis °Sinusitis is redness, soreness, and swelling (inflammation) of the paranasal sinuses. Paranasal sinuses are air pockets within the bones of your face (beneath the eyes, the middle of the forehead, or above the eyes). In healthy paranasal sinuses, mucus is able to drain out, and air is able to circulate through them by way of your nose. However, when your paranasal sinuses are inflamed, mucus and air can become trapped. This can allow bacteria and other germs to grow and cause infection. °Sinusitis can develop quickly and last only a short time (acute) or continue over a long period (chronic). Sinusitis that lasts for more than 12 weeks is considered chronic.  °CAUSES  °Causes of sinusitis include: °· Allergies. °· Structural abnormalities, such as displacement of the cartilage that separates your nostrils (deviated septum), which can decrease the air flow through your nose and sinuses and affect sinus drainage. °· Functional abnormalities, such as when the small hairs (cilia) that line your sinuses and help remove mucus do not work properly or are not present. °  SYMPTOMS  °Symptoms of acute and chronic sinusitis are the same. The primary symptoms are pain and pressure around the affected sinuses. Other symptoms include: °· Upper  toothache. °· Earache. °· Headache. °· Bad breath. °· Decreased sense of smell and taste. °· A cough, which worsens when you are lying flat. °· Fatigue. °· Fever. °· Thick drainage from your nose, which often is green and may contain pus (purulent). °· Swelling and warmth over the affected sinuses. °DIAGNOSIS  °Your caregiver will perform a physical exam. During the exam, your caregiver may: °· Look in your nose for signs of abnormal growths in your nostrils (nasal polyps). °· Tap over the affected sinus to check for signs of infection. °· View the inside of your sinuses (endoscopy) with a special imaging device with a light attached (endoscope), which is inserted into your sinuses. °If your caregiver suspects that you have chronic sinusitis, one or more of the following tests may be recommended: °· Allergy tests. °· Nasal culture A sample of mucus is taken from your nose and sent to a lab and screened for bacteria. °· Nasal cytology A sample of mucus is taken from your nose and examined by your caregiver to determine if your sinusitis is related to an allergy. °TREATMENT  °Most cases of acute sinusitis are related to a viral infection and will resolve on their own within 10 days. Sometimes medicines are prescribed to help relieve symptoms (pain medicine, decongestants, nasal steroid sprays, or saline sprays).  °However, for sinusitis related to a bacterial infection, your caregiver will prescribe antibiotic medicines. These are medicines that will help kill the bacteria causing the infection.  °Rarely, sinusitis is caused by a fungal infection. In theses cases, your caregiver will prescribe antifungal medicine. °For some cases of chronic sinusitis, surgery is needed. Generally, these are cases in which sinusitis recurs more than 3 times per year, despite other treatments. °HOME CARE INSTRUCTIONS  °· Drink plenty of water. Water helps thin the mucus so your sinuses can drain more easily. °· Use a  humidifier. °· Inhale steam 3 to 4 times a day (for example, sit in the bathroom with the shower running). °· Apply a warm, moist washcloth to your face 3 to 4 times a day, or as directed by your caregiver. °· Use saline nasal sprays to help moisten and clean your sinuses. °· Take over-the-counter or prescription medicines for pain, discomfort, or fever only as directed by your caregiver. °SEEK IMMEDIATE MEDICAL CARE IF: °· You have increasing pain or severe headaches. °· You have nausea, vomiting, or drowsiness. °· You have swelling around your face. °· You have vision problems. °· You have a stiff neck. °· You have difficulty breathing. °MAKE SURE YOU:  °· Understand these instructions. °· Will watch your condition. °· Will get help right away if you are not doing well or get worse. °Document Released: 01/08/2005 Document Revised: 04/02/2011 Document Reviewed: 01/23/2011 °ExitCare® Patient Information ©2014 [employer]. ° °

## 2013-05-18 NOTE — ED Provider Notes (Signed)
CSN: [id number]     Arrival date & time 05/18/13  1228 History   First MD Initiated Contact with Patient 05/18/13 1340     Chief Complaint  Patient presents with  . Sinusitis     (Consider location/radiation/quality/duration/timing/severity/associated sxs/prior Treatment) Patient is a 23 y.o. female presenting with sinusitis. The history is provided by the patient. No language interpreter was used.  Sinusitis Pain details:    Location:  Maxillary   Quality:  Aching   Severity:  Moderate   Duration:  1 week   Timing:  Constant Progression:  Worsening Chronicity:  New Relieved by:  Nothing Worsened by:  Nothing tried Ineffective treatments:  None tried Associated symptoms: headaches and rhinorrhea   Associated symptoms: no sore throat   Risk factors: no allergic reaction   Pt is using augmentin, afrin and an eye drop   Past Medical History  Diagnosis Date  . Iron deficiency   . Headache   . Anemia    Past Surgical History  Procedure Laterality Date  . Eye surgery     No family history on file. History  Substance Use Topics  . Smoking status: Never Smoker   . Smokeless tobacco: Never Used  . Alcohol Use: No   OB History   Grav Para Term Preterm Abortions TAB SAB Ect Mult Living                 Review of Systems  HENT: Positive for nosebleeds, rhinorrhea and sinus pressure. Negative for sore throat.   Neurological: Positive for headaches.  All other systems reviewed and are negative.     Allergies  Review of patient's allergies indicates no known allergies.  Home Medications   Prior to Admission medications   Medication Sig Start Date End Date Taking? Authorizing Provider  amoxicillin-clavulanate (AUGMENTIN) 875-125 MG per tablet Take 1 tablet by mouth 2 (two) times daily. 05/10/13   Hilario Quarryanielle S Ray, MD  ferrous sulfate 325 (65 FE) MG tablet Take 325 mg by mouth daily with breakfast.    Historical Provider, MD   BP 125/60  Pulse 88  Temp(Src) 98.4 F  (36.9 C) (Oral)  Resp 18  Ht 5\' 7"  (1.702 m)  Wt 140 lb (63.504 kg)  BMI 21.92 kg/m2  SpO2 99%  LMP 05/10/2013 Physical Exam  Constitutional: She is oriented to person, place, and time. She appears well-developed.  HENT:  Head: Normocephalic and atraumatic.  Eyes: Pupils are equal, round, and reactive to light.  Neck: Normal range of motion. Neck supple.  Cardiovascular: Normal rate.   Pulmonary/Chest: Effort normal.  Abdominal: Soft.  Musculoskeletal: Normal range of motion.  Neurological: She is alert and oriented to person, place, and time. She has normal reflexes.  Skin: Skin is warm.  Psychiatric: She has a normal mood and affect.    ED Course  Procedures (including critical care time) Labs Review Labs Reviewed - No data to display  Imaging Review No results found.   EKG Interpretation None      MDM   Final diagnoses:  Sinusitis  Allergic rhinitis    flonase  Zyrtec prednisone    Elson AreasLeslie K Farida Mcreynolds, PA-C 05/18/13 1430

## 2013-05-18 NOTE — ED Provider Notes (Signed)
Medical screening examination/treatment/procedure(s) were performed by non-physician practitioner and as supervising physician I was immediately available for consultation/collaboration.   EKG Interpretation None        Drucilla Cumber B. Bernette MayersSheldon, MD 05/18/13 1431

## 2013-08-06 ENCOUNTER — Emergency Department (HOSPITAL_BASED_OUTPATIENT_CLINIC_OR_DEPARTMENT_OTHER)
Admission: EM | Admit: 2013-08-06 | Discharge: 2013-08-06 | Disposition: A | Discharge status: Home / Self Care | Payer: Self-pay | Attending: Emergency Medicine | Admitting: Emergency Medicine

## 2013-08-06 ENCOUNTER — Encounter (HOSPITAL_BASED_OUTPATIENT_CLINIC_OR_DEPARTMENT_OTHER): Payer: Self-pay | Admitting: Emergency Medicine

## 2013-08-06 ENCOUNTER — Emergency Department (HOSPITAL_BASED_OUTPATIENT_CLINIC_OR_DEPARTMENT_OTHER): Payer: Medicaid Other

## 2013-08-06 ENCOUNTER — Emergency Department (HOSPITAL_BASED_OUTPATIENT_CLINIC_OR_DEPARTMENT_OTHER): Payer: Self-pay

## 2013-08-06 DIAGNOSIS — Z79899 Other long term (current) drug therapy: Secondary | ICD-10-CM | POA: Insufficient documentation

## 2013-08-06 DIAGNOSIS — Z3202 Encounter for pregnancy test, result negative: Secondary | ICD-10-CM | POA: Insufficient documentation

## 2013-08-06 DIAGNOSIS — Z792 Long term (current) use of antibiotics: Secondary | ICD-10-CM | POA: Insufficient documentation

## 2013-08-06 DIAGNOSIS — N730 Acute parametritis and pelvic cellulitis: Secondary | ICD-10-CM | POA: Insufficient documentation

## 2013-08-06 DIAGNOSIS — D509 Iron deficiency anemia, unspecified: Secondary | ICD-10-CM | POA: Insufficient documentation

## 2013-08-06 DIAGNOSIS — D649 Anemia, unspecified: Secondary | ICD-10-CM | POA: Insufficient documentation

## 2013-08-06 DIAGNOSIS — IMO0002 Reserved for concepts with insufficient information to code with codable children: Secondary | ICD-10-CM | POA: Insufficient documentation

## 2013-08-06 LAB — URINALYSIS, ROUTINE W REFLEX MICROSCOPIC
Bilirubin Urine: NEGATIVE
Glucose, UA: NEGATIVE mg/dL
Hgb urine dipstick: NEGATIVE
Ketones, ur: 15 mg/dL — AB
NITRITE: NEGATIVE
Protein, ur: NEGATIVE mg/dL
SPECIFIC GRAVITY, URINE: 1.028 (ref 1.005–1.030)
UROBILINOGEN UA: 1 mg/dL (ref 0.0–1.0)
pH: 7.5 (ref 5.0–8.0)

## 2013-08-06 LAB — WET PREP, GENITAL
Trich, Wet Prep: NONE SEEN
YEAST WET PREP: NONE SEEN

## 2013-08-06 LAB — URINE MICROSCOPIC-ADD ON

## 2013-08-06 LAB — CBC WITH DIFFERENTIAL/PLATELET
BASOS PCT: 0 % (ref 0–1)
Basophils Absolute: 0 10*3/uL (ref 0.0–0.1)
EOS ABS: 0.1 10*3/uL (ref 0.0–0.7)
EOS PCT: 1 % (ref 0–5)
HEMATOCRIT: 29.2 % — AB (ref 36.0–46.0)
Hemoglobin: 9.4 g/dL — ABNORMAL LOW (ref 12.0–15.0)
Lymphocytes Relative: 15 % (ref 12–46)
Lymphs Abs: 0.9 10*3/uL (ref 0.7–4.0)
MCH: 26.6 pg (ref 26.0–34.0)
MCHC: 32.2 g/dL (ref 30.0–36.0)
MCV: 82.7 fL (ref 78.0–100.0)
MONO ABS: 0.6 10*3/uL (ref 0.1–1.0)
MONOS PCT: 10 % (ref 3–12)
NEUTROS ABS: 4.9 10*3/uL (ref 1.7–7.7)
Neutrophils Relative %: 75 % (ref 43–77)
Platelets: 208 10*3/uL (ref 150–400)
RBC: 3.53 MIL/uL — ABNORMAL LOW (ref 3.87–5.11)
RDW: 12.8 % (ref 11.5–15.5)
WBC: 6.5 10*3/uL (ref 4.0–10.5)

## 2013-08-06 LAB — COMPREHENSIVE METABOLIC PANEL
ALBUMIN: 3.8 g/dL (ref 3.5–5.2)
ALT: 9 U/L (ref 0–35)
ANION GAP: 13 (ref 5–15)
AST: 17 U/L (ref 0–37)
Alkaline Phosphatase: 46 U/L (ref 39–117)
BILIRUBIN TOTAL: 0.3 mg/dL (ref 0.3–1.2)
BUN: 11 mg/dL (ref 6–23)
CALCIUM: 9.7 mg/dL (ref 8.4–10.5)
CHLORIDE: 104 meq/L (ref 96–112)
CO2: 23 mEq/L (ref 19–32)
Creatinine, Ser: 0.7 mg/dL (ref 0.50–1.10)
GFR calc Af Amer: >90 mL/min (ref >90)
GFR calc non Af Amer: >90 mL/min (ref >90)
Glucose, Bld: 96 mg/dL (ref 70–99)
Potassium: 4.1 mEq/L (ref 3.7–5.3)
Sodium: 140 mEq/L (ref 137–147)
Total Protein: 8 g/dL (ref 6.0–8.3)

## 2013-08-06 LAB — LIPASE, BLOOD: Lipase: 34 U/L (ref 11–59)

## 2013-08-06 LAB — PREGNANCY, URINE: Preg Test, Ur: NEGATIVE

## 2013-08-06 MED ORDER — MORPHINE SULFATE 4 MG/ML IJ SOLN
4.0000 mg | Freq: Once | INTRAMUSCULAR | Status: AC
  Administered 2013-08-06: 4 mg via INTRAVENOUS
  Filled 2013-08-06: qty 1

## 2013-08-06 MED ORDER — LIDOCAINE HCL (PF) 1 % IJ SOLN
INTRAMUSCULAR | Status: AC
  Administered 2013-08-06: 5 mL
  Filled 2013-08-06: qty 5

## 2013-08-06 MED ORDER — OXYCODONE-ACETAMINOPHEN 5-325 MG PO TABS
2.0000 | ORAL_TABLET | Freq: Once | ORAL | Status: AC
  Administered 2013-08-06: 2 via ORAL
  Filled 2013-08-06: qty 2

## 2013-08-06 MED ORDER — IOHEXOL 300 MG/ML  SOLN
100.0000 mL | Freq: Once | INTRAMUSCULAR | Status: AC | PRN
  Administered 2013-08-06: 100 mL via INTRAVENOUS

## 2013-08-06 MED ORDER — CEFTRIAXONE SODIUM 250 MG IJ SOLR
250.0000 mg | Freq: Once | INTRAMUSCULAR | Status: AC
  Administered 2013-08-06: 250 mg via INTRAMUSCULAR
  Filled 2013-08-06: qty 250

## 2013-08-06 MED ORDER — SODIUM CHLORIDE 0.9 % IV BOLUS (SEPSIS)
1000.0000 mL | Freq: Once | INTRAVENOUS | Status: AC
  Administered 2013-08-06: 1000 mL via INTRAVENOUS

## 2013-08-06 MED ORDER — IOHEXOL 300 MG/ML  SOLN
50.0000 mL | Freq: Once | INTRAMUSCULAR | Status: AC | PRN
  Administered 2013-08-06: 50 mL via ORAL

## 2013-08-06 MED ORDER — ONDANSETRON HCL 4 MG/2ML IJ SOLN
INTRAMUSCULAR | Status: AC
  Administered 2013-08-06: 4 mg
  Filled 2013-08-06: qty 2

## 2013-08-06 MED ORDER — DOXYCYCLINE HYCLATE 100 MG PO TABS
100.0000 mg | ORAL_TABLET | Freq: Once | ORAL | Status: AC
  Administered 2013-08-06: 100 mg via ORAL
  Filled 2013-08-06: qty 1

## 2013-08-06 MED ORDER — OXYCODONE-ACETAMINOPHEN 5-325 MG PO TABS: 1.0000 | ORAL_TABLET | ORAL | Status: DC | PRN

## 2013-08-06 MED ORDER — DOXYCYCLINE HYCLATE 100 MG PO CAPS: 100.0000 mg | ORAL_CAPSULE | Freq: Two times a day (BID) | ORAL | Status: DC

## 2013-08-06 NOTE — ED Provider Notes (Signed)
CSN: [id number]     Arrival date & time 08/06/13  1459 History   First MD Initiated Contact with Patient 08/06/13 1550     Chief Complaint  Patient presents with  . Abdominal Pain     (Consider location/radiation/quality/duration/timing/severity/associated sxs/prior Treatment) HPI 23 year old female presents with lower abdominal pain. She states that the pain started when her most recent period started one month ago. She's been continuing to have this period since then. Denies any fevers. No nausea or vomiting. She's also developed a recurrent headache over the last week or so. This pain is significantly worsened over the last 3 days. Is a constant pain that she points to her entire lower abdomen. She's also been having about 4 days of brown vaginal discharge. The pain is currently an 8/10. She tried ibuprofen with minimal relief.  Past Medical History  Diagnosis Date  . Iron deficiency   . Headache   . Anemia    Past Surgical History  Procedure Laterality Date  . Eye surgery     No family history on file. History  Substance Use Topics  . Smoking status: Never Smoker   . Smokeless tobacco: Never Used  . Alcohol Use: No   OB History   Grav Para Term Preterm Abortions TAB SAB Ect Mult Living                 Review of Systems  Constitutional: Negative for fever.  Gastrointestinal: Positive for abdominal pain. Negative for vomiting.  Genitourinary: Positive for vaginal bleeding and vaginal discharge. Negative for dysuria.  Musculoskeletal: Positive for back pain.  Neurological: Positive for headaches.  All other systems reviewed and are negative.     Allergies  Review of patient's allergies indicates no known allergies.  Home Medications   Prior to Admission medications   Medication Sig Start Date End Date Taking? Authorizing Provider  amoxicillin-clavulanate (AUGMENTIN) 875-125 MG per tablet Take 1 tablet by mouth 2 (two) times daily. 05/10/13   Hilario Quarry, MD   cetirizine-pseudoephedrine (ZYRTEC-D) 5-120 MG per tablet Take 1 tablet by mouth daily. 05/18/13   Elson Areas, PA-C  ferrous sulfate 325 (65 FE) MG tablet Take 325 mg by mouth daily with breakfast.    Historical Provider, MD  fluticasone (FLONASE) 50 MCG/ACT nasal spray Place 2 sprays into both nostrils daily. 05/18/13   Elson Areas, PA-C  predniSONE (DELTASONE) 10 MG tablet 6,5,4,3,2,1 taper 05/18/13   Elson Areas, PA-C   BP 121/63  Pulse 96  Temp(Src) 99.3 F (37.4 C) (Oral)  Resp 18  Ht 5\' 7"  (1.702 m)  Wt 140 lb (63.504 kg)  BMI 21.92 kg/m2  SpO2 100%  LMP 07/22/2013 Physical Exam  Nursing note and vitals reviewed. Constitutional: She is oriented to person, place, and time. She appears well-developed and well-nourished.  HENT:  Head: Normocephalic and atraumatic.  Right Ear: External ear normal.  Left Ear: External ear normal.  Nose: Nose normal.  Eyes: Right eye exhibits no discharge. Left eye exhibits no discharge.  Cardiovascular: Normal rate, regular rhythm and normal heart sounds.   Pulmonary/Chest: Effort normal and breath sounds normal.  Abdominal: Soft. There is tenderness (diffuse, moderate tenderness worst in RLQ).  Genitourinary: Uterus is tender. Cervix exhibits motion tenderness and discharge. Right adnexum displays no mass. Left adnexum displays no mass.  Neurological: She is alert and oriented to person, place, and time.  Skin: Skin is warm and dry.    ED Course  Procedures (including critical care  time) Labs Review Labs Reviewed  WET PREP, GENITAL - Abnormal; Notable for the following:    Clue Cells Wet Prep HPF POC FEW (*)    WBC, Wet Prep HPF POC MANY (*)    All other components within normal limits  URINALYSIS, ROUTINE W REFLEX MICROSCOPIC - Abnormal; Notable for the following:    Ketones, ur 15 (*)    Leukocytes, UA SMALL (*)    All other components within normal limits  CBC WITH DIFFERENTIAL - Abnormal; Notable for the following:    RBC  3.53 (*)    Hemoglobin 9.4 (*)    HCT 29.2 (*)    All other components within normal limits  GC/CHLAMYDIA PROBE AMP  PREGNANCY, URINE  URINE MICROSCOPIC-ADD ON  COMPREHENSIVE METABOLIC PANEL  LIPASE, BLOOD    Imaging Review US Transvaginal Non-ob  08/06/2013   CLINICAL DATA:  Vaginal discharge  EXAM: TRANSABDOMINAL AND TRANSVAGINAL ULTRASOUND OF PELVIS  TECHNIQUE: Study was performed transabdominally to optimize pelvic field of view evaluation and transvaginally to optimize internal visceral architecture evaluation.  COMPARISON:  April 01, 2013  FINDINGS: Uterus  Measurements: 8.3 x 4.5 x 4.7 cm. There is no evidence of intrauterine mass. There is increased flow within the myometrium with a subtly inhomogeneous appearance to the myometrial tissue. There is no well-defined inflammatory focus.  Endometrium  Thickness: 4 mm. No focal abnormality visualized. Contour is smooth.  Right ovary  Measurements: 3.7 x 2.0 x 3.1 cm. Normal appearance/no adnexal mass.  Left ovary  Measurements: 4.3 x 1.6 x 3.0 cm. Normal appearance/no adnexal mass.  Other findings  No free fluid.  IMPRESSION: Question inflammatory change within the myometrium given subtle inhomogeneity to the echotexture and increased color flow. No well-defined mass or abscess seen. Study otherwise unremarkable. No endometrial lesions seen. No extrauterine pelvic mass or fluid.   Electronically Signed   By: Bretta Bang M.D.   On: 08/06/2013 21:34   US Pelvis Complete  08/06/2013   CLINICAL DATA:  Vaginal discharge  EXAM: TRANSABDOMINAL AND TRANSVAGINAL ULTRASOUND OF PELVIS  TECHNIQUE: Study was performed transabdominally to optimize pelvic field of view evaluation and transvaginally to optimize internal visceral architecture evaluation.  COMPARISON:  April 01, 2013  FINDINGS: Uterus  Measurements: 8.3 x 4.5 x 4.7 cm. There is no evidence of intrauterine mass. There is increased flow within the myometrium with a subtly inhomogeneous  appearance to the myometrial tissue. There is no well-defined inflammatory focus.  Endometrium  Thickness: 4 mm. No focal abnormality visualized. Contour is smooth.  Right ovary  Measurements: 3.7 x 2.0 x 3.1 cm. Normal appearance/no adnexal mass.  Left ovary  Measurements: 4.3 x 1.6 x 3.0 cm. Normal appearance/no adnexal mass.  Other findings  No free fluid.  IMPRESSION: Question inflammatory change within the myometrium given subtle inhomogeneity to the echotexture and increased color flow. No well-defined mass or abscess seen. Study otherwise unremarkable. No endometrial lesions seen. No extrauterine pelvic mass or fluid.   Electronically Signed   By: Bretta Bang M.D.   On: 08/06/2013 21:34   Ct Abdomen Pelvis W Contrast  08/06/2013   CLINICAL DATA:  Right lower quadrant pain.  EXAM: CT ABDOMEN AND PELVIS WITH CONTRAST  TECHNIQUE: Multidetector CT imaging of the abdomen and pelvis was performed using the standard protocol following bolus administration of intravenous contrast.  CONTRAST:  OMNIPAQUE IOHEXOL 300 MG/ML  SOLN  COMPARISON:  None.  FINDINGS: The lung bases are within normal.  Abdominal images demonstrate a normal  liver, spleen, pancreas, gallbladder and adrenal glands. Stomach is mildly distended. Kidneys are normal in size without hydronephrosis or nephrolithiasis. There is no adenopathy or free fluid. There is no focal inflammatory change present. The appendix is within normal.  Remaining pelvic structures are within normal.  IMPRESSION: No acute findings in the abdomen/pelvis.   Electronically Signed   By: Elberta Fortisaniel  Boyle M.D.   On: 08/06/2013 19:09     EKG Interpretation None      MDM   Final diagnoses:  PID (acute pelvic inflammatory disease)    Patient's exam is consistent with PID. Given that her symptoms were worse on the right lower abdomen/pelvis a workup was done to rule out appendicitis and TOA. CT and ultrasound are negative for these findings. Her pain did  improve in the emergency department. She was given Rocephin and will start on 2 weeks of doxy. At this time she is afebrile, has no vomiting, and her pain is controlled. I feel she is stable for outpatient management of her PID. Discussed strict return precautions and will give follow up with gynecology.    Audree CamelScott T Lorris Carducci, MD 08/06/13 [id number]

## 2013-08-06 NOTE — Discharge Instructions (Signed)
Pelvic Inflammatory Disease °Pelvic inflammatory disease (PID) refers to an infection in some or all of the female organs. The infection can be in the uterus, ovaries, fallopian tubes, or the surrounding tissues in the pelvis. PID can cause abdominal or pelvic pain that comes on suddenly (acute pelvic pain). PID is a serious infection because it can lead to lasting (chronic) pelvic pain or the inability to have children (infertile).  °CAUSES  °The infection is often caused by the normal bacteria found in the vaginal tissues. PID may also be caused by an infection that is spread during sexual contact. PID can also occur following:  °· The birth of a baby.   °· A miscarriage.   °· An abortion.   °· Major pelvic surgery.   °· The use of an intrauterine device (IUD).   °· A sexual assault.   °RISK FACTORS °Certain factors can put a person at higher risk for PID, such as: °· Being younger than 25 years. °· Being sexually active at a young age. °· Using nonbarrier contraception. °· Having multiple sexual partners. °· Having sex with someone who has symptoms of a genital infection. °· Using oral contraception. °Other times, certain behaviors can increase the possibility of getting PID, such as: °· Having sex during your period. °· Using a vaginal douche. °· Having an intrauterine device (IUD) in place. °SYMPTOMS  °· Abdominal or pelvic pain.   °· Fever.   °· Chills.   °· Abnormal vaginal discharge. °· Abnormal uterine bleeding.   °· Unusual pain shortly after finishing your period. °DIAGNOSIS  °Your caregiver will choose some of the following methods to make a diagnosis, such as:  °· Performing a physical exam and history. A pelvic exam typically reveals a very tender uterus and surrounding pelvis.   °· Ordering laboratory tests including a pregnancy test, blood tests, and urine test.  °· Ordering cultures of the vagina and cervix to check for a sexually transmitted infection (STI). °· Performing an ultrasound.    °· Performing a laparoscopic procedure to look inside the pelvis.   °TREATMENT  °· Antibiotic medicines may be prescribed and taken by mouth.   °· Sexual partners may be treated when the infection is caused by a sexually transmitted disease (STD).   °· Hospitalization may be needed to give antibiotics intravenously. °· Surgery may be needed, but this is rare. °It may take weeks until you are completely well. If you are diagnosed with PID, you should also be checked for human immunodeficiency virus (HIV).   °HOME CARE INSTRUCTIONS  °· If given, take your antibiotics as directed. Finish the medicine even if you start to feel better.   °· Only take over-the-counter or prescription medicines for pain, discomfort, or fever as directed by your caregiver.   °· Do not have sexual intercourse until treatment is completed or as directed by your caregiver. If PID is confirmed, your recent sexual partner(s) will need treatment.   °· Keep your follow-up appointments. °SEEK MEDICAL CARE IF:  °· You have increased or abnormal vaginal discharge.   °· You need prescription medicine for your pain.   °· You vomit.   °· You cannot take your medicines.   °· Your partner has an STD.   °SEEK IMMEDIATE MEDICAL CARE IF:  °· You have a fever.   °· You have increased abdominal or pelvic pain.   °· You have chills.   °· You have pain when you urinate.   °· You are not better after 72 hours following treatment.   °MAKE SURE YOU:  °· Understand these instructions. °· Will watch your condition. °· Will get help right away if you are not doing well or get worse. °  Document Released: 01/08/2005 Document Revised: 05/05/2012 Document Reviewed: 01/04/2011 °ExitCare® Patient Information ©2015 [employer]. This information is not intended to replace advice given to you by your health care provider. Make sure you discuss any questions you have with your health care provider. ° °

## 2013-08-06 NOTE — ED Notes (Signed)
Abdominal pain. Yellow vaginal discharge.

## 2013-08-08 LAB — GC/CHLAMYDIA PROBE AMP
CT Probe RNA: POSITIVE — AB
GC PROBE AMP APTIMA: NEGATIVE

## 2013-08-09 ENCOUNTER — Telehealth (HOSPITAL_BASED_OUTPATIENT_CLINIC_OR_DEPARTMENT_OTHER): Payer: Self-pay | Admitting: Emergency Medicine

## 2013-08-09 NOTE — Telephone Encounter (Signed)
patient returned call, ID verified x three. Notified of + Chlamydia and that treatment given with Doxycycline RX. Patient confirmed she was taking Doxycycline as prescribed and [facility] as instructed by EDP. STD instructions provided, patient verbalized understanding.

## 2013-08-09 NOTE — Telephone Encounter (Signed)
Positive Chlamydia Treated with Doxycycline and Rocephin, [facility] faxed Will contact patient.  08/09/13 @ 1205 -- left message for patient to call flow managers #

## 2013-09-03 ENCOUNTER — Emergency Department (HOSPITAL_COMMUNITY)
Admission: EM | Admit: 2013-09-03 | Discharge: 2013-09-03 | Disposition: A | Discharge status: Home / Self Care | Payer: Medicaid Other | Attending: Emergency Medicine | Admitting: Emergency Medicine

## 2013-09-03 ENCOUNTER — Encounter (HOSPITAL_COMMUNITY): Payer: Self-pay | Admitting: Emergency Medicine

## 2013-09-03 DIAGNOSIS — N949 Unspecified condition associated with female genital organs and menstrual cycle: Secondary | ICD-10-CM | POA: Insufficient documentation

## 2013-09-03 DIAGNOSIS — Z3202 Encounter for pregnancy test, result negative: Secondary | ICD-10-CM | POA: Insufficient documentation

## 2013-09-03 DIAGNOSIS — Z862 Personal history of diseases of the blood and blood-forming organs and certain disorders involving the immune mechanism: Secondary | ICD-10-CM | POA: Insufficient documentation

## 2013-09-03 DIAGNOSIS — R1030 Lower abdominal pain, unspecified: Secondary | ICD-10-CM

## 2013-09-03 DIAGNOSIS — IMO0002 Reserved for concepts with insufficient information to code with codable children: Secondary | ICD-10-CM | POA: Insufficient documentation

## 2013-09-03 DIAGNOSIS — R109 Unspecified abdominal pain: Secondary | ICD-10-CM | POA: Insufficient documentation

## 2013-09-03 DIAGNOSIS — N938 Other specified abnormal uterine and vaginal bleeding: Secondary | ICD-10-CM | POA: Insufficient documentation

## 2013-09-03 LAB — CBC WITH DIFFERENTIAL/PLATELET
BASOS ABS: 0 10*3/uL (ref 0.0–0.1)
Basophils Relative: 1 % (ref 0–1)
Eosinophils Absolute: 0.1 10*3/uL (ref 0.0–0.7)
Eosinophils Relative: 2 % (ref 0–5)
HCT: 28.6 % — ABNORMAL LOW (ref 36.0–46.0)
Hemoglobin: 9 g/dL — ABNORMAL LOW (ref 12.0–15.0)
Lymphocytes Relative: 38 % (ref 12–46)
Lymphs Abs: 1.4 10*3/uL (ref 0.7–4.0)
MCH: 24.9 pg — ABNORMAL LOW (ref 26.0–34.0)
MCHC: 31.5 g/dL (ref 30.0–36.0)
MCV: 79 fL (ref 78.0–100.0)
Monocytes Absolute: 0.3 10*3/uL (ref 0.1–1.0)
Monocytes Relative: 9 % (ref 3–12)
NEUTROS ABS: 1.8 10*3/uL (ref 1.7–7.7)
NEUTROS PCT: 50 % (ref 43–77)
Platelets: 188 10*3/uL (ref 150–400)
RBC: 3.62 MIL/uL — ABNORMAL LOW (ref 3.87–5.11)
RDW: 13.4 % (ref 11.5–15.5)
WBC: 3.7 10*3/uL — ABNORMAL LOW (ref 4.0–10.5)

## 2013-09-03 LAB — BASIC METABOLIC PANEL
ANION GAP: 12 (ref 5–15)
BUN: 13 mg/dL (ref 6–23)
CALCIUM: 9.7 mg/dL (ref 8.4–10.5)
CO2: 24 mEq/L (ref 19–32)
CREATININE: 0.94 mg/dL (ref 0.50–1.10)
Chloride: 104 mEq/L (ref 96–112)
GFR calc Af Amer: >90 mL/min (ref >90)
GFR calc non Af Amer: 86 mL/min — ABNORMAL LOW (ref >90)
Glucose, Bld: 91 mg/dL (ref 70–99)
Potassium: 3.8 mEq/L (ref 3.7–5.3)
SODIUM: 140 meq/L (ref 137–147)

## 2013-09-03 LAB — WET PREP, GENITAL
Trich, Wet Prep: NONE SEEN
Yeast Wet Prep HPF POC: NONE SEEN

## 2013-09-03 LAB — URINALYSIS, ROUTINE W REFLEX MICROSCOPIC
Bilirubin Urine: NEGATIVE
Glucose, UA: NEGATIVE mg/dL
Ketones, ur: NEGATIVE mg/dL
LEUKOCYTES UA: NEGATIVE
Nitrite: NEGATIVE
PROTEIN: NEGATIVE mg/dL
Specific Gravity, Urine: 1.019 (ref 1.005–1.030)
UROBILINOGEN UA: 0.2 mg/dL (ref 0.0–1.0)
pH: 7.5 (ref 5.0–8.0)

## 2013-09-03 LAB — URINE MICROSCOPIC-ADD ON

## 2013-09-03 LAB — POC URINE PREG, ED: PREG TEST UR: NEGATIVE

## 2013-09-03 MED ORDER — TRAMADOL HCL 50 MG PO TABS: 50.0000 mg | ORAL_TABLET | Freq: Four times a day (QID) | ORAL | Status: DC | PRN

## 2013-09-03 NOTE — ED Provider Notes (Signed)
CSN: [id number]     Arrival date & time 09/03/13  2029 History   First MD Initiated Contact with Patient 09/03/13 2125     Chief Complaint  Patient presents with  . Abdominal Pain     (Consider location/radiation/quality/duration/timing/severity/associated sxs/prior Treatment) HPI A 23 yo female presents to the ED with a complaint of abdominal pain for the past two weeks.  She states that it is a sharp pain that is constant and has progressively gotten worse over the past two weeks.  She states it used to be localized to the lower abdomen, and she now feels pain in the epigastric region as well.  Nothing makes the pain better or worse.  Pt states the pain is unrelated to eating.  Denies nausea, vomiting, reflux, fever, urinary frequency, or dysuria.  She was treated about a month ago for PID.  She states she initially felt relief but the pain has returned.  It is not quite as intense as it was when she was diagnosed with PID.  Denies vaginal discharge. States she has not f/u with OB/GYN as they are not able to f/u with her until next month.   Past Medical History  Diagnosis Date  . Iron deficiency   . Headache   . Anemia    Past Surgical History  Procedure Laterality Date  . Eye surgery     No family history on file. History  Substance Use Topics  . Smoking status: Never Smoker   . Smokeless tobacco: Never Used  . Alcohol Use: No   OB History   Grav Para Term Preterm Abortions TAB SAB Ect Mult Living                 Review of Systems  Constitutional: Negative for fever and chills.  Gastrointestinal: Positive for abdominal pain. Negative for nausea, vomiting, diarrhea, constipation and blood in stool.  Genitourinary: Positive for vaginal bleeding (on menstraul cycle) and menstrual problem ( irregluar cycles). Negative for dysuria, urgency, hematuria, flank pain, decreased urine volume, vaginal discharge, vaginal pain and pelvic pain.  Musculoskeletal: Negative for back pain and  myalgias.  All other systems reviewed and are negative.     Allergies  Review of patient's allergies indicates no known allergies.  Home Medications   Prior to Admission medications   Medication Sig Start Date End Date Taking? Authorizing Provider  fluticasone (FLONASE) 50 MCG/ACT nasal spray Place 2 sprays into both nostrils daily. 05/18/13  Yes Lonia SkinnerLeslie K Sofia, PA-C  ibuprofen (ADVIL,MOTRIN) 200 MG tablet Take 200 mg by mouth every 6 (six) hours as needed for moderate pain.   Yes Historical Provider, MD  traMADol (ULTRAM) 50 MG tablet Take 1 tablet (50 mg total) by mouth every 6 (six) hours as needed. 09/03/13   Junius FinnerErin O'Malley, PA-C   BP 128/65  Pulse 77  Temp(Src) 98.8 F (37.1 C) (Oral)  Resp 18  SpO2 100%  LMP 08/27/2013 Physical Exam  Nursing note and vitals reviewed. Constitutional: She appears well-developed and well-nourished. No distress.  Pt lying comfortably in exam bed, NAD.   HENT:  Head: Normocephalic and atraumatic.  Eyes: Conjunctivae are normal. No scleral icterus.  Neck: Normal range of motion.  Cardiovascular: Normal rate, regular rhythm and normal heart sounds.   Pulmonary/Chest: Effort normal and breath sounds normal. No respiratory distress. She has no wheezes. She has no rales. She exhibits no tenderness.  Abdominal: Soft. Bowel sounds are normal. She exhibits no distension and no mass. There is tenderness (  lower abdominal, pelvic tenderness). There is no rebound and no guarding.  Genitourinary:  Chaperoned exam: normal external genitalia. Small amount red blood in vaginal canal. No CMT, adnexal tenderness or masses. No vaginal discharge.   Musculoskeletal: Normal range of motion.  Neurological: She is alert.  Skin: Skin is warm and dry. She is not diaphoretic.    ED Course  Procedures (including critical care time) Labs Review Labs Reviewed  WET PREP, GENITAL - Abnormal; Notable for the following:    Clue Cells Wet Prep HPF POC RARE (*)    WBC,  Wet Prep HPF POC RARE (*)    All other components within normal limits  BASIC METABOLIC PANEL - Abnormal; Notable for the following:    GFR calc non Af Amer 86 (*)    All other components within normal limits  CBC WITH DIFFERENTIAL - Abnormal; Notable for the following:    WBC 3.7 (*)    RBC 3.62 (*)    Hemoglobin 9.0 (*)    HCT 28.6 (*)    MCH 24.9 (*)    All other components within normal limits  URINALYSIS, ROUTINE W REFLEX MICROSCOPIC - Abnormal; Notable for the following:    Hgb urine dipstick MODERATE (*)    All other components within normal limits  URINE MICROSCOPIC-ADD ON - Abnormal; Notable for the following:    Squamous Epithelial / LPF FEW (*)    All other components within normal limits  GC/CHLAMYDIA PROBE AMP  POC URINE PREG, ED    Imaging Review No results found.   EKG Interpretation None      MDM   Final diagnoses:  Lower abdominal pain   Previous medical records reviewed. Pt is a 24yo female tx for PID last month presenting to ED with c/o lower abdominal pain x2 weeks. States she finished her antibiotics but did not f/u with OB/GYN.  Pt is tender in lower abdomen, not concerned for surgical abdomen. Pt appears non-toxic.  Temp: 99.3, otherwise undermarkable.  Labs: unremarkable. Due to recent tx of PID, will wait for GC/Chlamydia swabs to result. Will not tx empirically for STDs as pt had no CMT, adnexal tenderness or masses. No vaginal discharge. Strongly encouraged pt to f/u with women's outpatient clinic. [employer] guide also provided.      Junius Finner, PA-C 09/04/13 0038  Junius Finner, PA-C 09/04/13 [phone]

## 2013-09-03 NOTE — ED Notes (Signed)
PA aware pelvic is setup

## 2013-09-03 NOTE — ED Notes (Signed)
Pt presents with c/o mid abdominal pain that has been going on for approx 2 weeks. Pt says that the first week the pain was not as bad as it is now. Denies any vaginal discharge, N/V/D.

## 2013-09-03 NOTE — Discharge Instructions (Signed)
°Emergency Department Resource Guide °1) Find a Doctor and Pay Out of Pocket °Although you won't have to find out who is covered by your insurance plan, it is a good idea to ask around and get recommendations. You will then need to call the office and see if the doctor you have chosen will accept you as a new patient and what types of options they offer for patients who are self-pay. Some doctors offer discounts or will set up payment plans for their patients who do not have insurance, but you will need to ask so you aren't surprised when you get to your appointment. ° °2) Contact Your Local Health Department °Not all health departments have doctors that can see patients for sick visits, but many do, so it is worth a call to see if yours does. If you don't know where your local health department is, you can check in your phone book. The CDC also has a tool to help you locate your state's health department, and many state websites also have listings of all of their local health departments. ° °3) Find a Walk-in Clinic °If your illness is not likely to be very severe or complicated, you may want to try a walk in clinic. These are popping up all over the country in pharmacies, drugstores, and shopping centers. They're usually staffed by nurse practitioners or physician assistants that have been trained to treat common illnesses and complaints. They're usually fairly quick and inexpensive. However, if you have serious medical issues or chronic medical problems, these are probably not your best option. ° °No Primary Care Doctor: °- Call [facility] at  832-8000 - they can help you locate a primary care doctor that  accepts your insurance, provides certain services, etc. °- Physician Referral Service- 1-800-533-3463 ° °Chronic Pain Problems: °Organization         Address  Phone   Notes  °[facility]  (336) 297-2271 Patients need to be referred by their primary care doctor.  ° °Medication  Assistance: °Organization         Address  Phone   Notes  °[place] [facility] [address] °[place], [place] [address] (336) 641-8030 --Must be a resident of [place] °-- Must have NO insurance coverage whatsoever (no Medicaid/ Medicare, etc.) °-- The pt. MUST have a primary care doctor that directs their care regularly and follows them in the community °  °[facility]  (866) 331-1348   °[employer]  (888) 892-1162   ° °Agencies that provide inexpensive medical care: °Organization         Address  Phone   Notes  °[facility]  (336) 832-8035   °[facility]    (336) 832-7272   °[facility] [address] °[place],  [address] (336) 832-4777   °[facility] [address], °[place] (336) 271-4999   °[facility]    (336) 373-0678   °[facility]    (336) 272-1050   °[facility] ° [address], [place] Phone:  (336) 832-4444, Fax:  (336) 832-4440 Hours of Operation:  9 am - 6 pm, M-F.  Also accepts Medicaid/Medicare and self-pay.  °[facility] ° [address],  Phone: (336) 832-3150, Fax: (336) 832-3151. Hours of Operation:  8:30 am - 5:30 pm, M-F.  Also accepts Medicaid and self-pay.  °[facility] [place] [address], [place] Phone: (336) 878-6027   °[facility] [address], [place], [place] (336)723-1848, Ext. 123 Mondays & Thursdays: 7-9 AM.  First 15 patients are seen on a first come, first serve basis. °  ° °Medicaid-accepting [place] Providers: ° °Organization         Address  Phone   Notes  °[facility] [address], [place] (336) 641-2100 Also accepts self-pay patients.  °[facility] [address], [place] ° (336) 856-9996   °[facility] [address], [place]  (336) 288-8857   °[facility] [address] [place] [address], [place] (336) 299-7000   °Veita Bland [address], [place]  ° (336) 373-1557 Only accepts [employer] Access Medicaid patients after they have their name applied to their card.  ° °Self-Pay (no insurance) in [place]: ° °Organization         Address  Phone   Notes  °Sickle Cell Patients, [facility] [address], [place] (336) 832-1970   °[facility] [address], [place] (336) 832-4400   °[facility] ° [address], [place] (336) 992-4800   °[facility]/Dr. Osei-Bonsu ° [address] [place] [address], [place] or [address], [place] (336) [phone] Phone number for both [place] and [place] locations is the same.  °[facility] [address], [place] (336) 299-0000   °[facility] [address] [place] [address], [place] or [address] (336) 852-7530 °(336) 878-2260   °[facility] [address], [place] (336) 350-1642, phone; (336) 294-5005, fax Sees patients 1st and 3rd Saturday of every month.  Must not qualify for public or private insurance (i.e. Medicaid, Medicare, [place] Health Choice, Veterans' Benefits) • Household income should be no more than 200% of the poverty level •The clinic cannot treat you if you are pregnant or think you are pregnant • Sexually transmitted diseases are not treated at the clinic.  ° ° °Dental Care: °Organization         Address  Phone  Notes  °[place] [facility] [address], [place] (336) 641-6152 Accepts children up to age 21 who are enrolled in Medicaid or [place] Health Choice; pregnant women with a Medicaid card; and children who have applied for Medicaid or [place] Health Choice, but were declined, whose parents can pay a reduced fee at time of service.  °[place]  [facility] [place]  [address], [place] (336) 641-7733 Accepts children up to age 21 who are enrolled in Medicaid or [place] Health Choice; pregnant women with a Medicaid card; and children who have applied for Medicaid or  Health Choice, but were declined, whose parents can pay a reduced fee at time of service.  °[facility] ° [address], [place] (336) 641-4533 Patients are seen by appointment only. Walk-ins are not accepted. [facility] will see patients 18 years of age and older. °Monday - Tuesday (8am-5pm) °Most Wednesdays (8:30-5pm) °$30 per visit, cash only  °[facility] ° [address], [place] (336) 641-4533 Patients are seen by appointment only. Walk-ins are not accepted. [facility] will see patients 18 years of age and older. °One   Wednesday Evening (Monthly: Volunteer Based).  $30 per visit, cash only  °[facility]  (919) 537-3737 for adults; Children under age 4, call Graduate Pediatric Dentistry at (919) [phone]. Children aged 4-14, please call (919) [phone] to request a pediatric application. ° Dental services are provided in all areas of dental care including fillings, crowns and bridges, complete and partial dentures, implants, gum treatment, root canals, and extractions. Preventive care is also provided. Treatment is provided to both adults and children. °Patients are selected via a lottery and there is often a waiting list. °  °[facility] [address], °[place] ° (336) 763-8833 [email or web address] °  °[facility] [address], [place], [place] (336)723-1848, Ext. 123 Second and Fourth Thursday of each month, opens at 6:30 AM; Clinic ends at 9 AM.  Patients are seen on a first-come first-served basis, and a limited number are seen during each clinic.  ° °[facility] ° [address], [place], [place] (336) 723-7904    Eligibility Requirements °You must have lived in [place], [place], or [place] counties for at least the last three months. °  You cannot be eligible for state or federal sponsored healthcare insurance, including Veterans Administration, Medicaid, or Medicare. °  You generally cannot be eligible for healthcare insurance through your employer.  °  How to apply: °Eligibility screenings are held every Tuesday and Wednesday afternoon from 1:00 pm until 4:00 pm. You do not need an appointment for the interview!  °[facility] [address], [place], [place] 336-631-2330   °[facility]  336-342-8273   °[facility]  336-703-3100   °[facility]  336-570-6415   ° °Behavioral Health Resources in the Community: °Intensive Outpatient Programs °Organization         Address  Phone  Notes  °[facility] [address], [place], [place] 336-878-6098   °[facility] [address], [place] , [place] 336-832-9800   °[facility] [address], [place], [place] ° 336-882-2125   °[facility] [address],  °[place], [place] 1-800-853-5163 or 336-641-4981   °Substance Abuse Resources °Organization         Address  Phone  Notes  °[facility]  336-882-2125   °[facility]  336-784-9470   °[facility]  336-285-9073   °[facility]  336-845-3988   °Residential & Outpatient Substance Abuse Program  1-800-659-3381   °Psychological Services °Organization         Address  Phone  Notes  °[facility]  336- 832-9600   °[facility]  336- 378-7881   °[facility] [address], [place] 1-800-853-5163 or 336-641-4981   ° °Mobile Crisis Teams °Organization         Address  Phone  Notes  °[facility], Mobile Crisis Care Unit  1-877-626-1772   °Assertive °[facility] ° [address]  [place], [place] 336-834-9664   °Sharon DeEsch [address] °[place] 336-554-5454   ° °Self-Help/Support Groups °Organization         Address  Phone             Notes  °[facility] [place] - variety of support groups  336- 373-1402 Call for more information  °Narcotics Anonymous (NA), [facility] [address], °[place]  2 meetings at this location  ° °  Residential Treatment Programs °Organization         Address  Phone  Notes  °[facility] [address],    °[place]  1-866-801-8205   °[facility] ° [address], [place], [place] 704-293-8524   °[facility] [address], [place] 336-845-3988 Admissions: 8am-3pm M-F  °[facility] [address]    °[place], [place] 336-841-1104   °[facility] [address], [place], [place] 336-379-7146   °[facility] [address]  °[place], [place] 336-285-9073   °[facility] - Intensive Outpatient [address], [place], [place] 336-852-3033   °[facility] [address]  °[place], [place] 1-877-615-2722 or 336-784-9470   °[facility] [address] [place], [place] 336-227-7417 Accepts Medicaid  °[facility] [address]  °[place] 1-800-659-3381 Substance Abuse/Addiction Treatment  ° °[place] Behavioral Health Resources °Organization         Address  Phone  Notes  °[facility]  (888) 581-9988   °Julie Brannon, PhD [address] [place], [place]   (336) 349-5553 or (336) 951-0000   °[facility]   [address] °[place], [place] (336) 349-4454   °[facility] [address], [place], [place] (336) 342-8316 Insurance/Medicaid/sponsorship through [facility]  °[facility] [address]                                    [place], [place] (336) 342-8316 Therapy/tele-psych/case    °[facility] [address]  ° [place], [place] (336) 349-2233    °Dr. Arfeen  (336) 349-4544   °[facility] [place]  [employer] [place] [facility] 1) [address],  °2) [address], [place] °3)  [address], [place] (336) 349-3220 °(336) 342-7768 ° °(336) 342-8140   °[place] [facility] (336) 342-1394 or (336) 342-3537 (After Hours)    ° ° °

## 2013-09-03 NOTE — ED Notes (Signed)
Pt attempted to give urine sample and was unsuccessful. Pt used restroom before she was roomed in her current room. We will reattempt collection at 2230

## 2013-09-04 LAB — GC/CHLAMYDIA PROBE AMP
CT Probe RNA: NEGATIVE
GC Probe RNA: NEGATIVE

## 2013-09-06 NOTE — ED Provider Notes (Signed)
Medical screening examination/treatment/procedure(s) were performed by non-physician practitioner and as supervising physician I was immediately available for consultation/collaboration.   EKG Interpretation None        Joya Gaskinsonald W Emmanuell Kantz, MD 09/06/13 2036

## 2013-09-07 ENCOUNTER — Encounter: Payer: Self-pay | Admitting: Medical

## 2013-09-07 ENCOUNTER — Ambulatory Visit (INDEPENDENT_AMBULATORY_CARE_PROVIDER_SITE_OTHER): Payer: Medicaid Other | Admitting: Medical

## 2013-09-07 VITALS — BP 129/78 | HR 93 | Temp 98.5°F | Ht 66.0 in | Wt 154.7 lb

## 2013-09-07 DIAGNOSIS — N730 Acute parametritis and pelvic cellulitis: Secondary | ICD-10-CM | POA: Insufficient documentation

## 2013-09-07 HISTORY — DX: Acute parametritis and pelvic cellulitis: N73.0

## 2013-09-07 NOTE — Progress Notes (Signed)
Has been seen twice in emergency room for pelvic pain and was treated for PID.

## 2013-09-07 NOTE — Progress Notes (Signed)
Patient ID: Evelyn Sullivan, female   DOB: September 07, 1990, 23 y.o.   MRN: [id number]  History:  Ms. Evelyn Sullivan is a 23 y.o. G0P0000 who presents to clinic today for follow-up from [facility] for PID and pelvic pain.  [facility] and treated for PID. Cultures were positive for CT. Patient states that pain improved initially and now has returned. She rates her pain now at 4/10. She takes Ibuprofen with relief. She has not filled the Rx for pain medication that was given in [facility]U last week. When she was seen in [facility] last week, cultures and wet prep were negative. The patient is on Depo provera x 6 months. She is due for her next injection between 09/15/13 and 09/26/13. [facility]. She states a history of irregular periods which is why she was started on Depo Provera. She states that since being on the Depo Provera she has had very irregular bleeding. She states that she recently had bleeding x 3 weeks and is still having dark brown spotting today.   The following portions of the patient's history were reviewed and updated as appropriate: allergies, current medications, past family history, past medical history, past social history, past surgical history and problem list.  Review of Systems:  Pertinent items are noted in HPI.  Objective:  Physical Exam BP 129/78  Pulse 93  Temp(Src) 98.5 F (36.9 C) (Oral)  Ht 5\' 6"  (1.676 m)  Wt 154 lb 11.2 oz (70.171 kg)  BMI 24.98 kg/m2  LMP 08/27/2013 GENERAL: Well-developed, well-nourished female in no acute distress.  HEENT: Normocephalic, atraumatic.  LUNGS: Normal effort HEART: Regular rate  PELVIC: deferred  EXTREMITIES: No cyanosis, clubbing, or edema  Assessment & Plan:  Assessment: Break-through bleeding with the Depo Provera H/O irregular periods  Plans: Discussed birth control options and bleeding profiles associated with each Patient would like to consider Mirena IUD. Patient has  Medicaid pending and will return when insurance is active for IUD insertion or [facility] depending on cost Advised continued Ibuprofen PRN pain [facility] at her convenience. Patient [facility] to get next depo injection or change birth controls to avoid a lapse in contraception  Freddi StarrJulie N Ethier, PA-C 09/07/2013 3:29 PM

## 2013-09-07 NOTE — Patient Instructions (Signed)
Intrauterine Device Information An intrauterine device (IUD) is inserted into your uterus to prevent pregnancy. There are two types of IUDs available:   Copper IUD--This type of IUD is wrapped in copper wire and is placed inside the uterus. Copper makes the uterus and fallopian tubes produce a fluid that kills sperm. The copper IUD can stay in place for 10 years.  Hormone IUD--This type of IUD contains the hormone progestin (synthetic progesterone). The hormone thickens the cervical mucus and prevents sperm from entering the uterus. It also thins the uterine lining to prevent implantation of a fertilized egg. The hormone can weaken or kill the sperm that get into the uterus. One type of hormone IUD can stay in place for 5 years, and another type can stay in place for 3 years. Your health care provider will make sure you are a good candidate for a contraceptive IUD. Discuss with your health care provider the possible side effects.  ADVANTAGES OF AN INTRAUTERINE DEVICE  IUDs are highly effective, reversible, long acting, and low maintenance.   There are no estrogen-related side effects.   An IUD can be used when breastfeeding.   IUDs are not associated with weight gain.   The copper IUD works immediately after insertion.   The hormone IUD works right away if inserted within 7 days of your period starting. You will need to use a backup method of birth control for 7 days if the hormone IUD is inserted at any other time in your cycle.  The copper IUD does not interfere with your female hormones.   The hormone IUD can make heavy menstrual periods lighter and decrease cramping.   The hormone IUD can be used for 3 or 5 years.   The copper IUD can be used for 10 years. DISADVANTAGES OF AN INTRAUTERINE DEVICE  The hormone IUD can be associated with irregular bleeding patterns.   The copper IUD can make your menstrual flow heavier and more painful.   You may experience cramping and  vaginal bleeding after insertion.  Document Released: 12/13/2003 Document Revised: 09/10/2012 Document Reviewed: 06/29/2012 Exit[employer] Patient Information 2015 [employer]. This information is not intended to replace advice given to you by your health care provider. Make sure you discuss any questions you have with your health care provider.  

## 2013-09-30 ENCOUNTER — Ambulatory Visit: Payer: Self-pay | Admitting: Obstetrics & Gynecology

## 2015-01-12 IMAGING — US US TRANSVAGINAL NON-OB
1 series · 14 of 25 positions shown · non-contrast
Comparison: none

[Series 1: us transvaginal non-ob · 0.22mm/px · 14 of 60 slices shown]
[im 1/60]
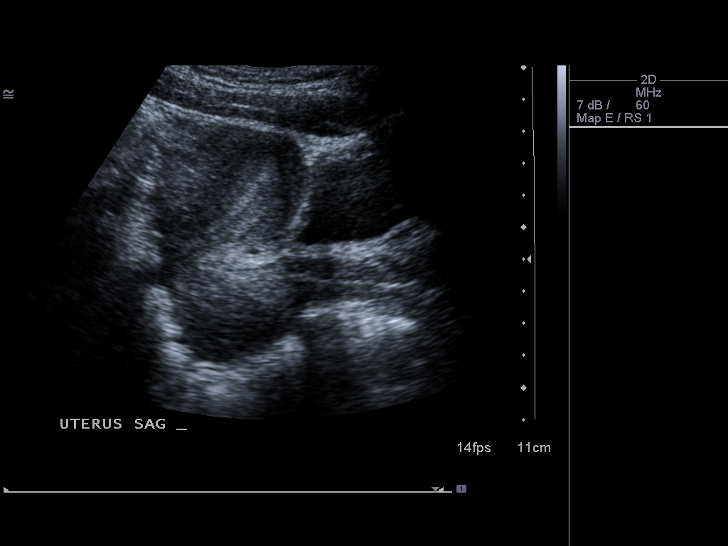
[im 5/60]
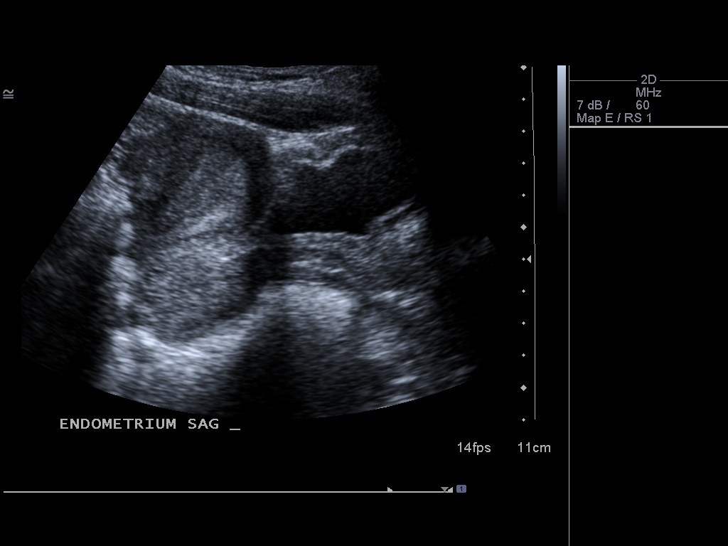
[im 10/60]
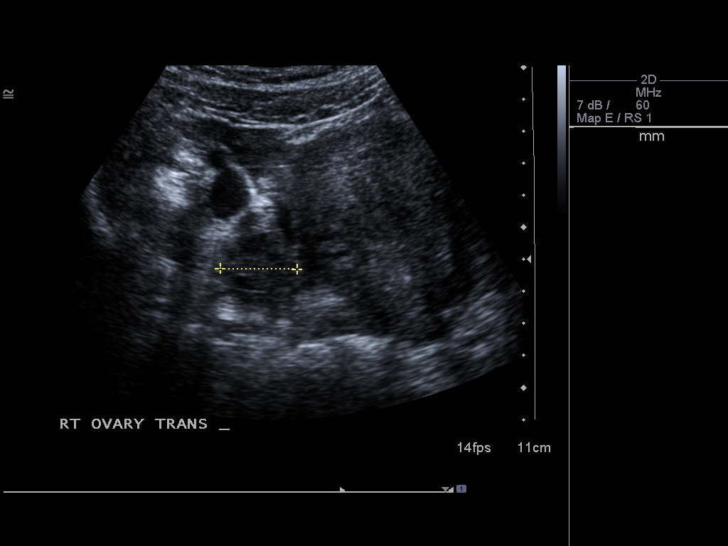
[im 15/60]
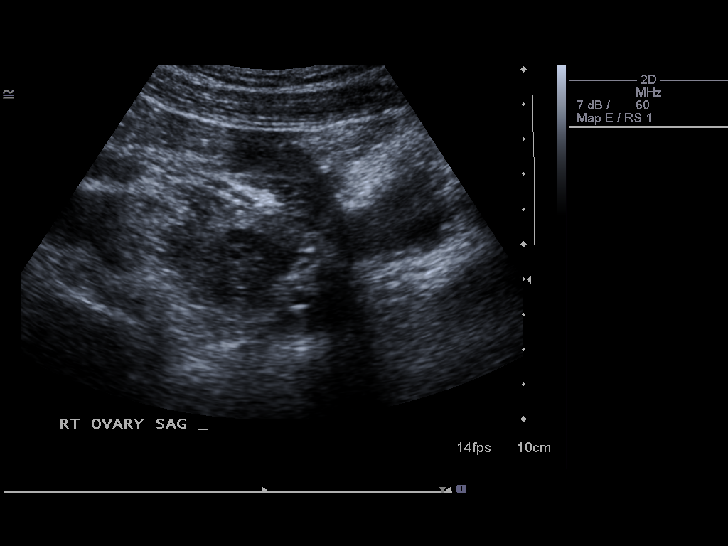
[im 20/60]
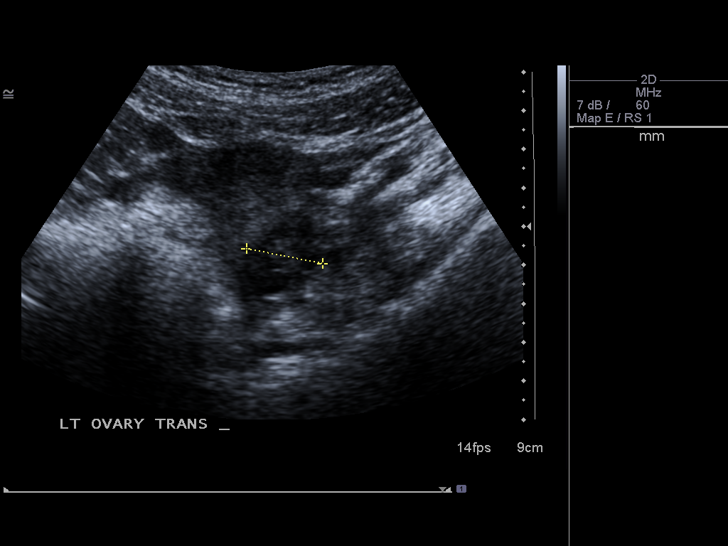
[im 23/60]
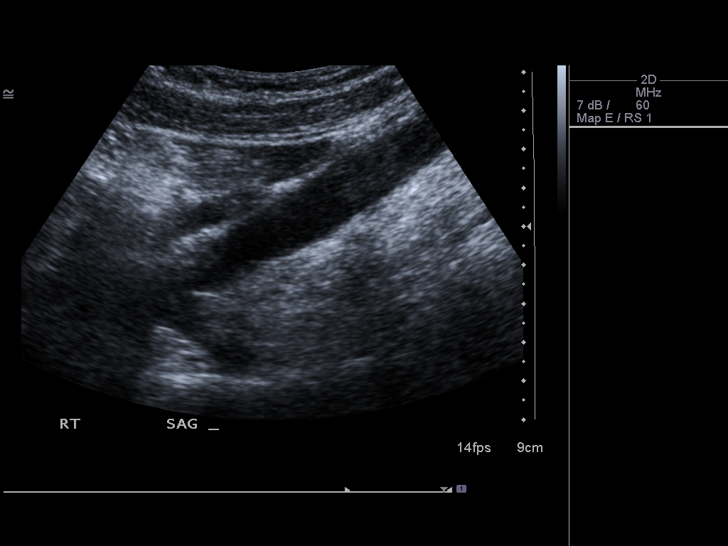
[im 28/60]
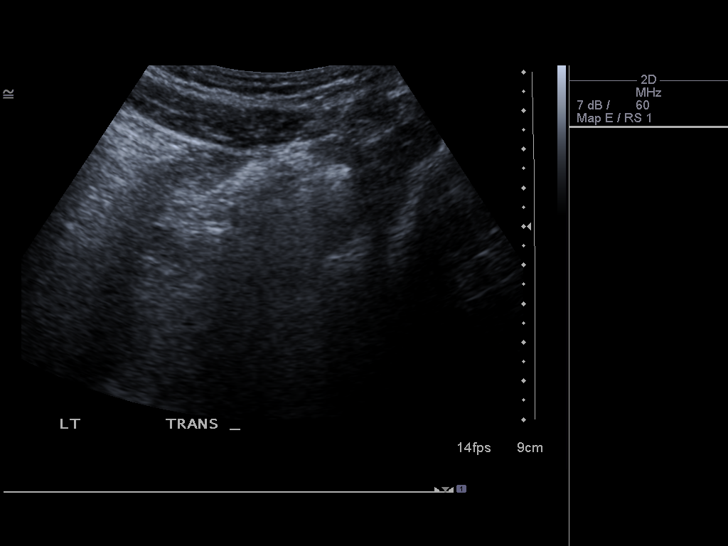
[im 32/60]
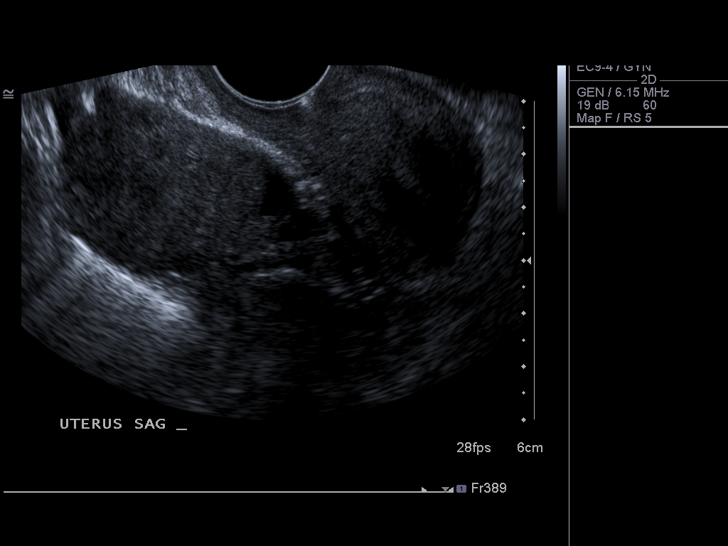
[im 37/60]
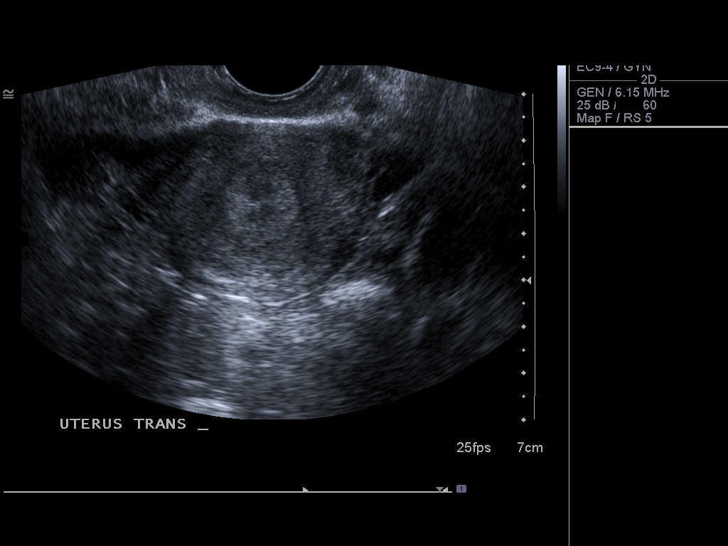
[im 40/60]
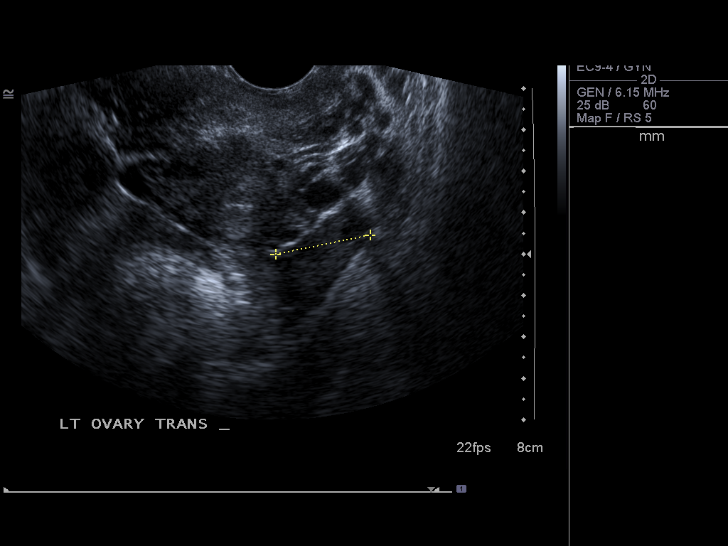
[im 45/60]
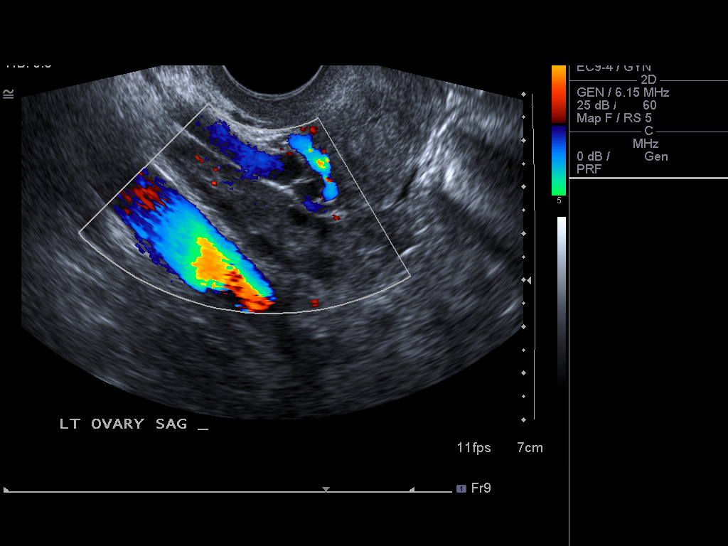
[im 50/60]
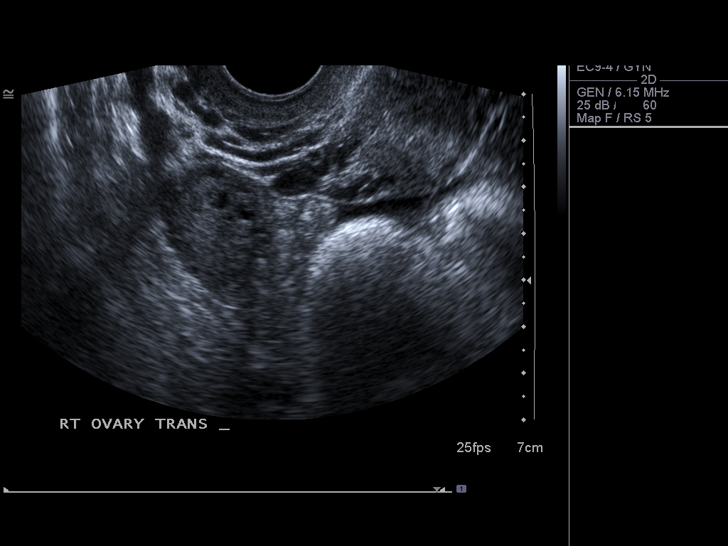
[im 55/60]
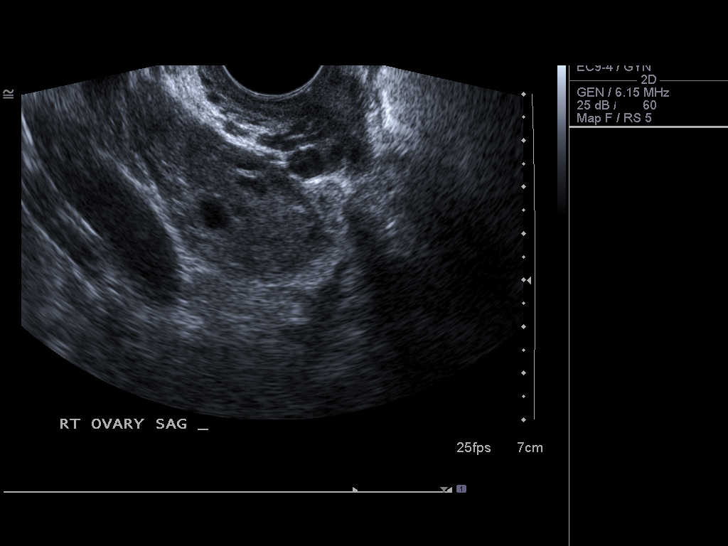
[im 60/60]
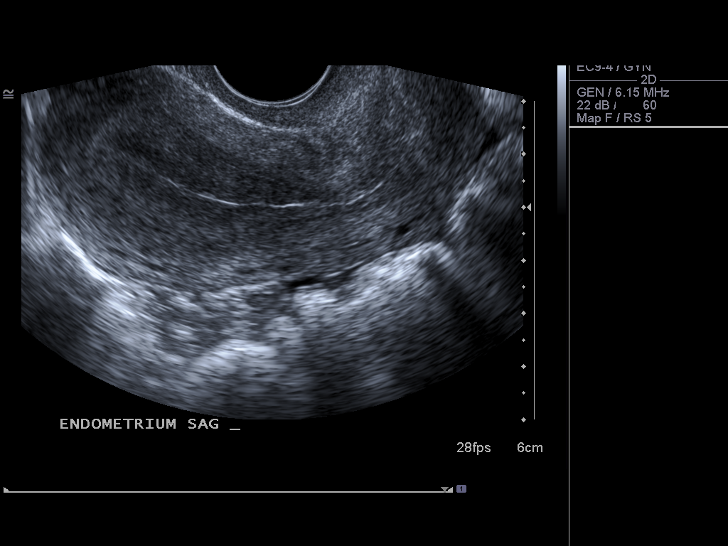

[14 of 25 positions shown; findings below may reference images not displayed]

CLINICAL DATA
Pelvic pain. Recent ectopic pregnancy January 2013. Negative
pregnancy test currently.

EXAM
TRANSABDOMINAL AND TRANSVAGINAL ULTRASOUND OF PELVIS

TECHNIQUE
Both transabdominal and transvaginal ultrasound examinations of the
pelvis were performed. Transabdominal technique was performed for
global imaging of the pelvis including uterus, ovaries, adnexal
regions, and pelvic cul-de-sac. It was necessary to proceed with
endovaginal exam following the transabdominal exam to visualize the
endometrium and adnexa.

COMPARISON
None

FINDINGS
Uterus

Measurements: 8 x 4 x 5 cm. No fibroids or other mass visualized.

Endometrium

Thickness: 2 cm, at the upper limits of normal. No focal abnormality
visualized.

Right ovary

Measurements: 3.7 x 2.6 x 2.4 cm. Normal appearance/no adnexal mass.

Left ovary

Measurements: 4.1 x 1.5 x 2.3 cm. Normal appearance/no adnexal mass.

Other findings

Trace, simple appearing free pelvic fluid.

IMPRESSION
Negative pelvic ultrasound.

SIGNATURE

## 2015-05-19 IMAGING — US US TRANSVAGINAL NON-OB
1 series · 14 of 25 positions shown · non-contrast
Comparison: April 01, 2013

CLINICAL DATA: Vaginal discharge

EXAM:
TRANSABDOMINAL AND TRANSVAGINAL ULTRASOUND OF PELVIS
TECHNIQUE: Study was performed transabdominally to optimize pelvic field of
view evaluation and transvaginally to optimize internal visceral
architecture evaluation.

[Series 1: us transvaginal non-ob · 0.30mm/px · 14 of 58 slices shown]
[im 1/58]
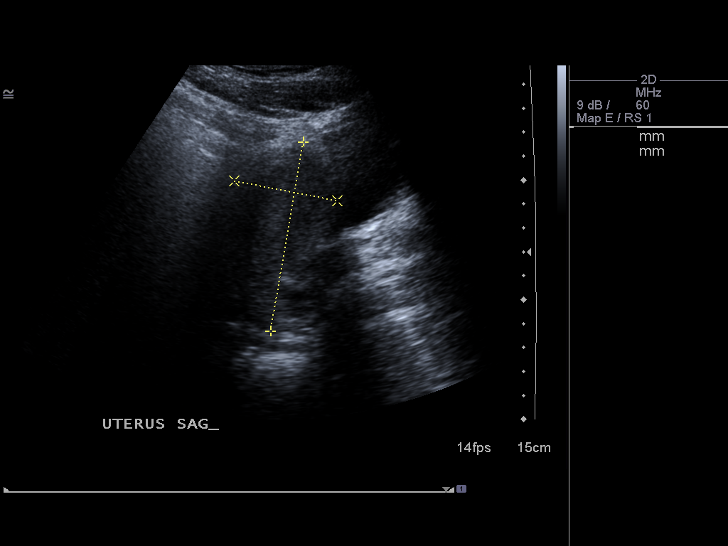
[im 5/58]
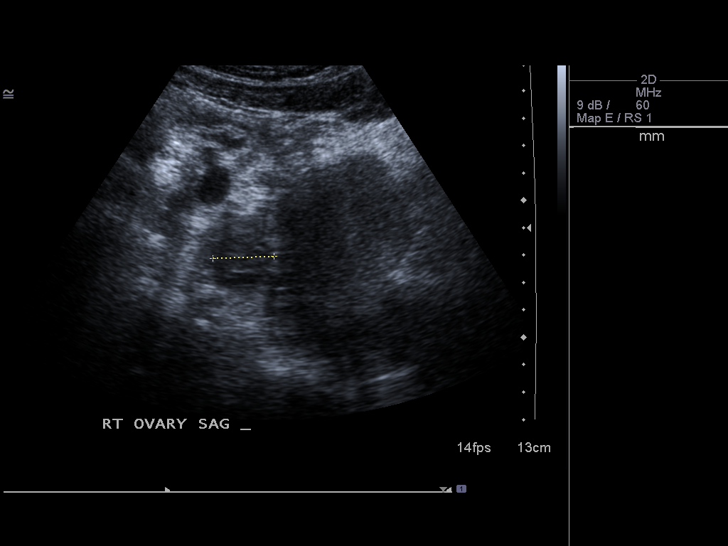
[im 10/58]
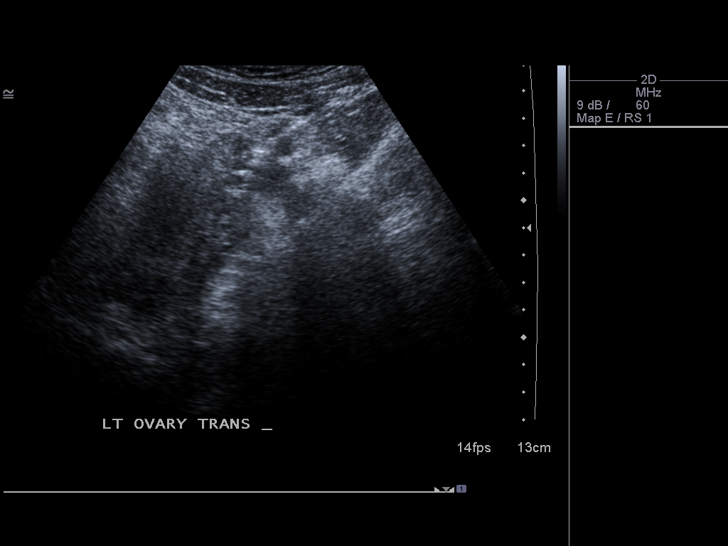
[im 15/58]
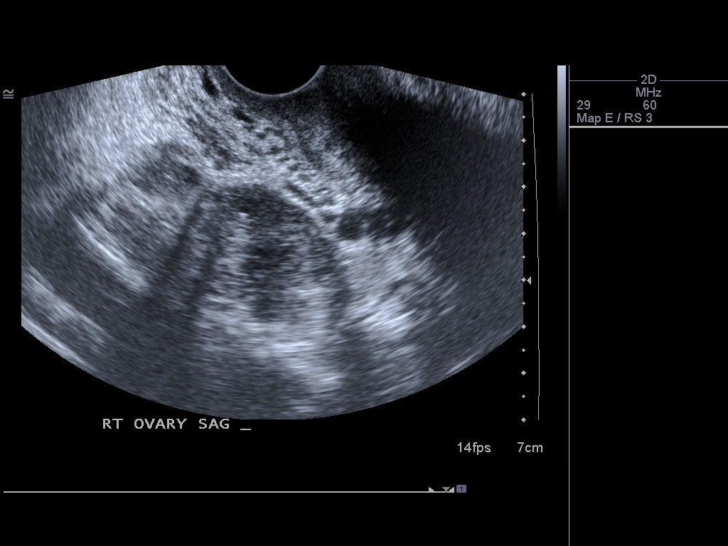
[im 20/58]
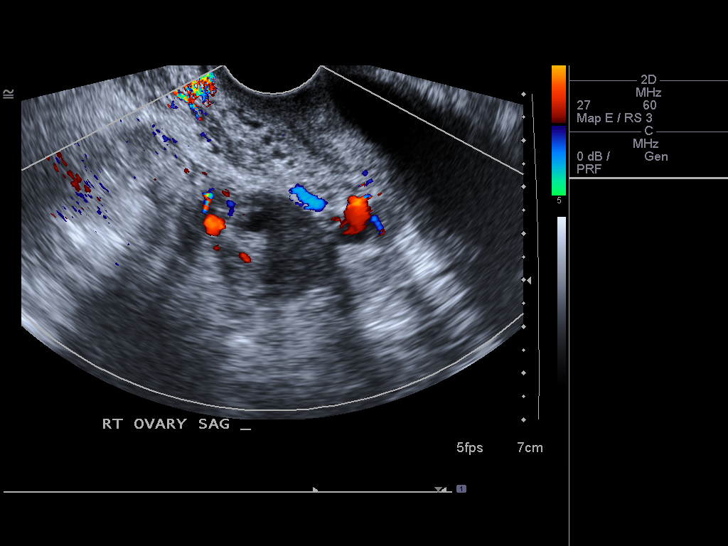
[im 22/58]
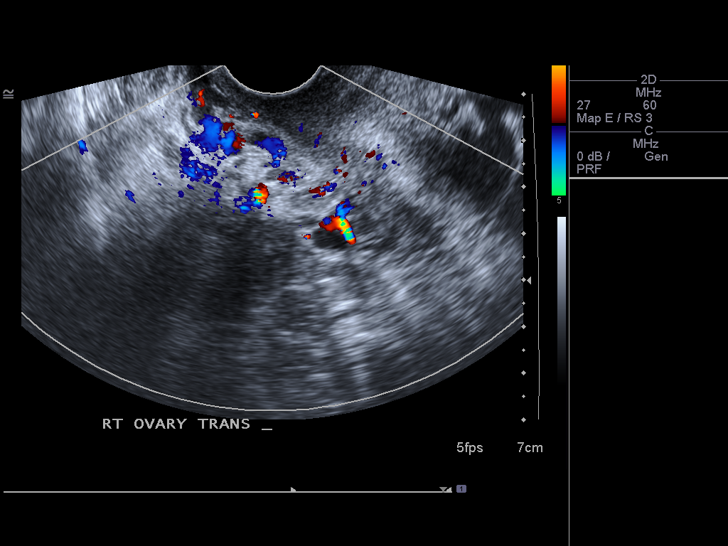
[im 27/58]
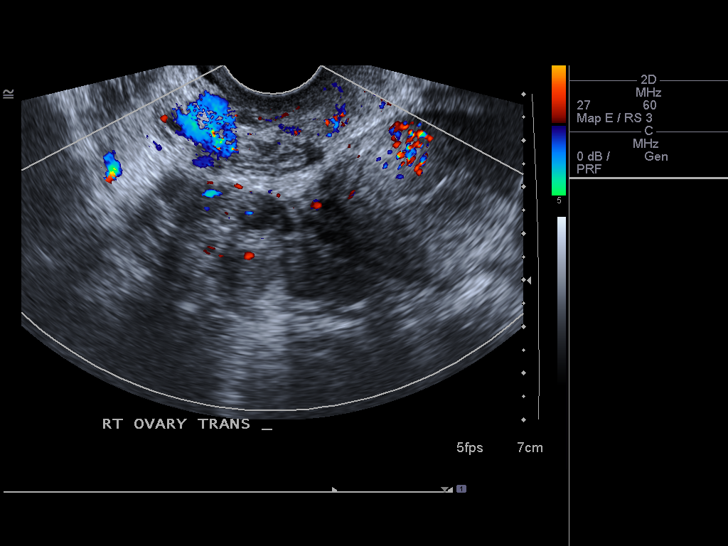
[im 31/58]
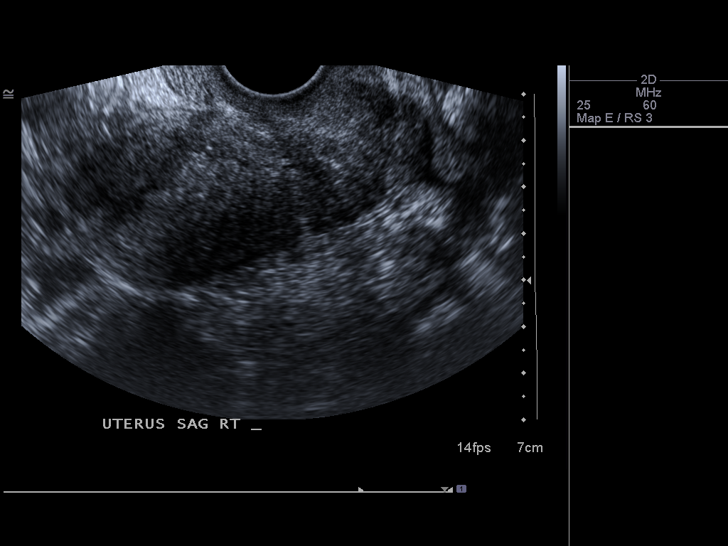
[im 36/58]
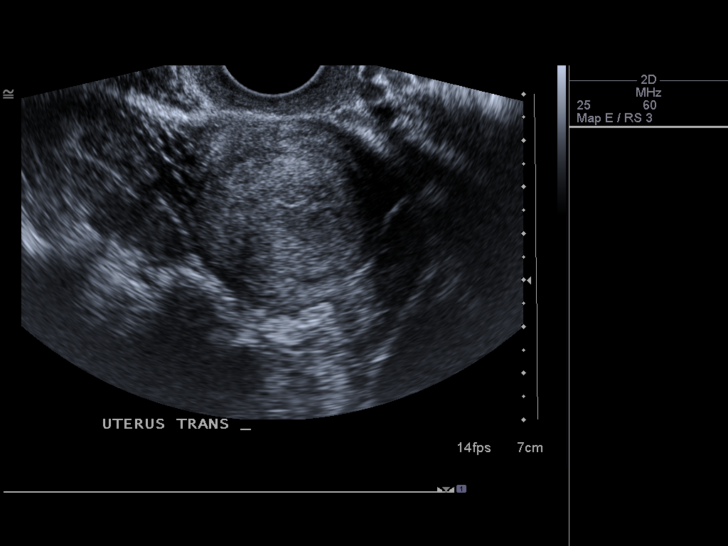
[im 39/58]
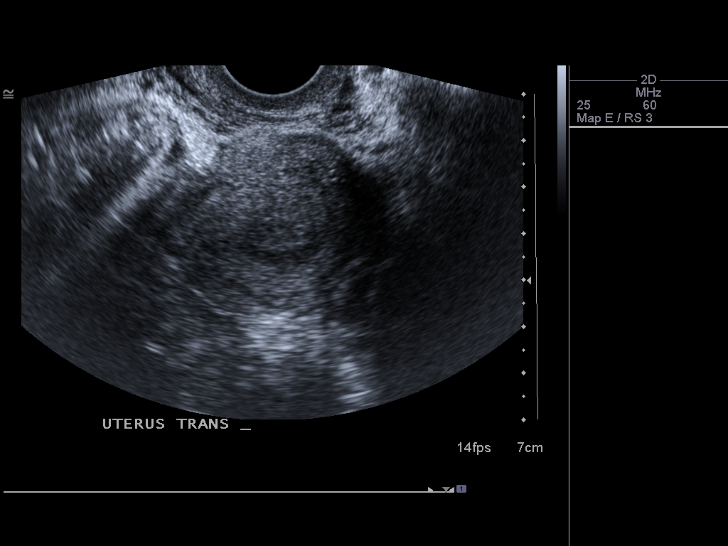
[im 43/58]
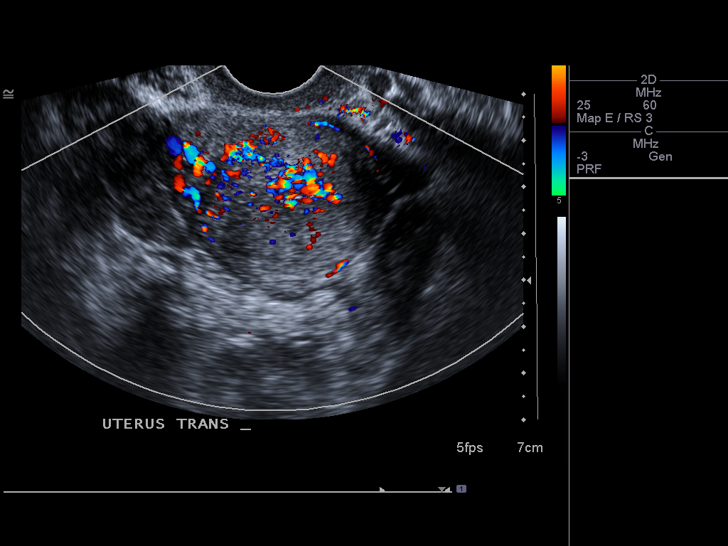
[im 48/58]
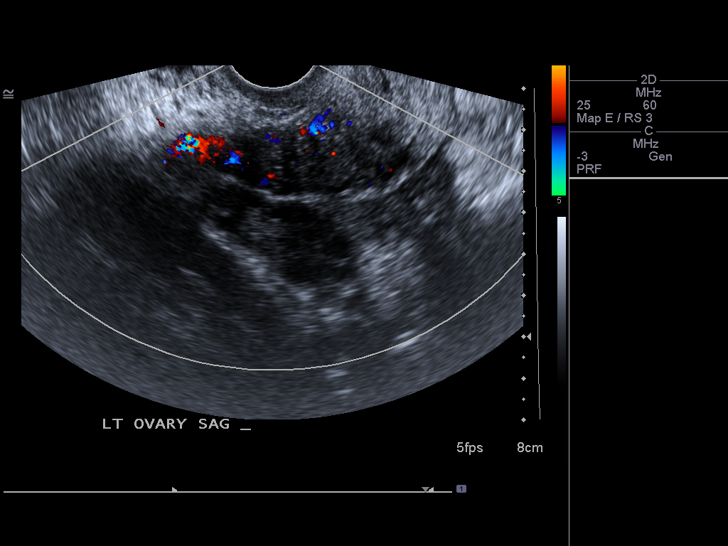
[im 53/58]
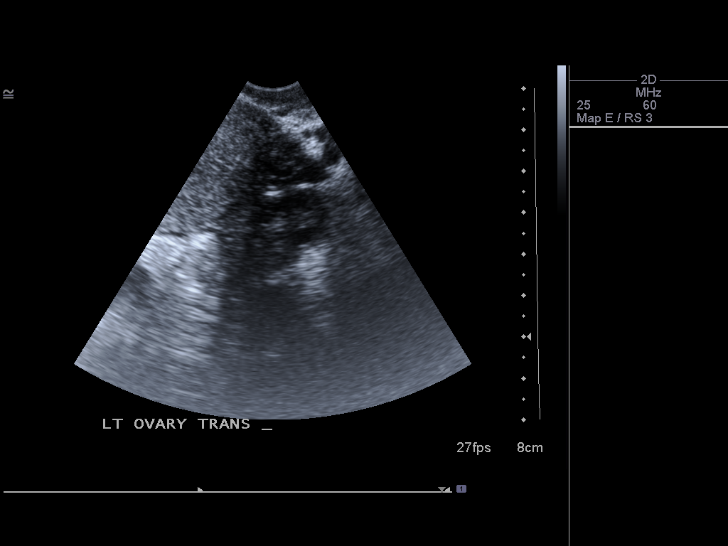
[im 58/58]
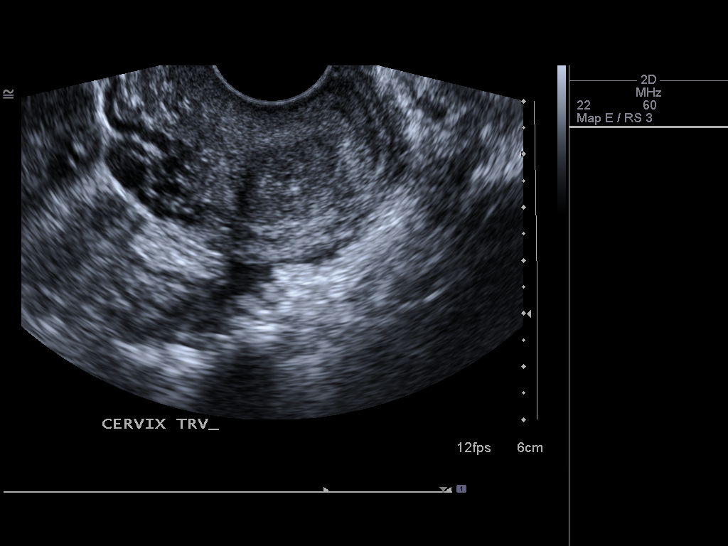

[14 of 25 positions shown; findings below may reference images not displayed]

FINDINGS: Uterus

Measurements: 8.3 x 4.5 x 4.7 cm. There is no evidence of
intrauterine mass. There is increased flow within the myometrium
with a subtly inhomogeneous appearance to the myometrial tissue.
There is no well-defined inflammatory focus.

Endometrium

Thickness: 4 mm. No focal abnormality visualized. Contour is smooth.

Right ovary

Measurements: 3.7 x 2.0 x 3.1 cm. Normal appearance/no adnexal mass.

Left ovary

Measurements: 4.3 x 1.6 x 3.0 cm. Normal appearance/no adnexal mass.

Other findings

No free fluid.
IMPRESSION: Question inflammatory change within the myometrium given subtle
inhomogeneity to the echotexture and increased color flow. No
well-defined mass or abscess seen. Study otherwise unremarkable. No
endometrial lesions seen. No extrauterine pelvic mass or fluid.

## 2016-01-28 ENCOUNTER — Inpatient Hospital Stay (HOSPITAL_COMMUNITY)
Admission: AD | Admit: 2016-01-28 | Discharge: 2016-01-28 | Disposition: A | Discharge status: Home / Self Care | Payer: Self-pay | Source: Ambulatory Visit | Attending: Obstetrics & Gynecology | Admitting: Obstetrics & Gynecology

## 2016-01-28 ENCOUNTER — Encounter (HOSPITAL_COMMUNITY): Payer: Self-pay

## 2016-01-28 ENCOUNTER — Inpatient Hospital Stay (HOSPITAL_COMMUNITY): Payer: Self-pay

## 2016-01-28 DIAGNOSIS — O200 Threatened abortion: Secondary | ICD-10-CM

## 2016-01-28 DIAGNOSIS — R102 Pelvic and perineal pain: Secondary | ICD-10-CM | POA: Insufficient documentation

## 2016-01-28 DIAGNOSIS — O26891 Other specified pregnancy related conditions, first trimester: Secondary | ICD-10-CM | POA: Insufficient documentation

## 2016-01-28 DIAGNOSIS — Z3A01 Less than 8 weeks gestation of pregnancy: Secondary | ICD-10-CM | POA: Insufficient documentation

## 2016-01-28 LAB — URINALYSIS, ROUTINE W REFLEX MICROSCOPIC
Bilirubin Urine: NEGATIVE
GLUCOSE, UA: NEGATIVE mg/dL
HGB URINE DIPSTICK: NEGATIVE
KETONES UR: NEGATIVE mg/dL
Leukocytes, UA: NEGATIVE
Nitrite: NEGATIVE
PROTEIN: NEGATIVE mg/dL
Specific Gravity, Urine: 1.03 (ref 1.005–1.030)
pH: 6 (ref 5.0–8.0)

## 2016-01-28 LAB — POCT PREGNANCY, URINE: Preg Test, Ur: POSITIVE — AB

## 2016-01-28 LAB — HCG, QUANTITATIVE, PREGNANCY: HCG, BETA CHAIN, QUANT, S: 3383 m[IU]/mL — AB (ref <5)

## 2016-01-28 NOTE — MAU Note (Signed)
Pt states she was told 4 days ago she was pregnant and has had abdominal pain for 2 days. Also states she has a migraine HA now

## 2016-01-28 NOTE — Discharge Instructions (Signed)
Threatened Miscarriage °A threatened miscarriage is when you have vaginal bleeding during your first 20 weeks of pregnancy but the pregnancy has not ended. Your doctor will do tests to make sure you are still pregnant. The cause of the bleeding may not be known. This condition does not mean your pregnancy will end. It does increase the risk of it ending (complete miscarriage). °Follow these instructions at home: °· Make sure you keep all your doctor visits for prenatal care. °· Get plenty of rest. °· Do not have sex or use tampons if you have vaginal bleeding. °· Do not douche. °· Do not smoke or use drugs. °· Do not drink alcohol. °· Avoid caffeine. °Contact a doctor if: °· You have light bleeding from your vagina. °· You have belly pain or cramping. °· You have a fever. °Get help right away if: °· You have heavy bleeding from your vagina. °· You have clots of blood coming from your vagina. °· You have bad pain or cramps in your low back or belly. °· You have fever, chills, and bad belly pain. °This information is not intended to replace advice given to you by your health care provider. Make sure you discuss any questions you have with your health care provider. °Document Released: 12/22/2007 Document Revised: 06/16/2015 Document Reviewed: 11/04/2012 °Elsevier Interactive Patient Education © 2017 [employer] ° °

## 2016-01-30 ENCOUNTER — Other Ambulatory Visit: Payer: Self-pay

## 2016-01-30 DIAGNOSIS — O3680X0 Pregnancy with inconclusive fetal viability, not applicable or unspecified: Secondary | ICD-10-CM

## 2016-01-31 ENCOUNTER — Telehealth: Payer: Self-pay

## 2016-01-31 LAB — HCG, QUANTITATIVE, PREGNANCY: hCG, Beta Chain, Quant, S: 4708.4 m[IU]/mL — ABNORMAL HIGH

## 2016-01-31 NOTE — Telephone Encounter (Signed)
Per Dr Shawnie PonsPratt, patient's bhcg levels from yesterday are not rising appropriately. Patient needs to return to office tomorrow 1/10 for stat bhcg & patient should wait for results. Called patient & informed her of results & recommended plan of care as well as asked how she was doing. Patient verbalized understanding to all & states she can be here tomorrow at 12:45pm. Patient reports only mild cramps. Patient had no questions. Front office informed

## 2016-02-01 ENCOUNTER — Ambulatory Visit: Payer: Self-pay | Admitting: *Deleted

## 2016-02-01 DIAGNOSIS — O3680X0 Pregnancy with inconclusive fetal viability, not applicable or unspecified: Secondary | ICD-10-CM

## 2016-02-01 LAB — HCG, QUANTITATIVE, PREGNANCY: HCG, BETA CHAIN, QUANT, S: 9524 m[IU]/mL — AB (ref <5)

## 2016-02-01 NOTE — Progress Notes (Signed)
Reviewed patients hcg level with Dr. Shawnie PonsPratt. Levels are rising appropriately. Ultrasound scheduled for 02/09/16. Pt agreeable to plan and had no further questions.

## 2016-02-09 ENCOUNTER — Encounter: Payer: Self-pay | Admitting: *Deleted

## 2016-02-09 ENCOUNTER — Ambulatory Visit: Payer: Self-pay | Admitting: *Deleted

## 2016-02-09 ENCOUNTER — Ambulatory Visit (HOSPITAL_COMMUNITY)
Admission: RE | Admit: 2016-02-09 | Discharge: 2016-02-09 | Disposition: A | Discharge status: Home / Self Care | Payer: Self-pay | Source: Ambulatory Visit | Attending: Obstetrics and Gynecology | Admitting: Obstetrics and Gynecology

## 2016-02-09 ENCOUNTER — Ambulatory Visit (HOSPITAL_COMMUNITY): Payer: Self-pay

## 2016-02-09 DIAGNOSIS — N8311 Corpus luteum cyst of right ovary: Secondary | ICD-10-CM | POA: Insufficient documentation

## 2016-02-09 DIAGNOSIS — O3481 Maternal care for other abnormalities of pelvic organs, first trimester: Secondary | ICD-10-CM | POA: Insufficient documentation

## 2016-02-09 DIAGNOSIS — Z3A01 Less than 8 weeks gestation of pregnancy: Secondary | ICD-10-CM | POA: Insufficient documentation

## 2016-02-09 DIAGNOSIS — O200 Threatened abortion: Secondary | ICD-10-CM | POA: Insufficient documentation

## 2016-02-09 DIAGNOSIS — Z712 Person consulting for explanation of examination or test findings: Secondary | ICD-10-CM

## 2016-02-09 DIAGNOSIS — O3680X0 Pregnancy with inconclusive fetal viability, not applicable or unspecified: Secondary | ICD-10-CM

## 2016-02-09 NOTE — Progress Notes (Signed)
Pt presents to clinic for results of viability u/s. Results reviewed with Dr Vergie LivingPickens. [facility] Picture and pregnancy verification letter given. Reviewed allergies and medications. Patient undecided on where she will start pnc, options discussed. Understanding voiced.

## 2016-02-11 NOTE — MAU Provider Note (Signed)
History   G1 @ apx 8 wks in with low abd pain for several days, cramping in nature. Denies vag bleeding.  CSN: [id number]  Arrival date & time 01/28/16  1638   None     Chief Complaint  Patient presents with  . Abdominal Pain    HPI  Past Medical History:  Diagnosis Date  . Anemia   . Headache   . Iron deficiency     Past Surgical History:  Procedure Laterality Date  . EYE SURGERY      Family History  Problem Relation Age of Onset  . Cancer Brother     Social History  Substance Use Topics  . Smoking status: Never Smoker  . Smokeless tobacco: Never Used  . Alcohol use No    OB History    Gravida Para Term Preterm AB Living   1 0 0 0 0 0   SAB TAB Ectopic Multiple Live Births   0 0 0 0        Review of Systems  Constitutional: Negative.   HENT: Negative.   Eyes: Negative.   Respiratory: Negative.   Cardiovascular: Negative.   Gastrointestinal: Positive for abdominal pain.  Endocrine: Negative.   Genitourinary: Negative.   Musculoskeletal: Negative.   Skin: Negative.   Allergic/Immunologic: Negative.   Neurological: Negative.   Hematological: Negative.   Psychiatric/Behavioral: Negative.     Allergies  Patient has no known allergies.  Home Medications   Current Outpatient Rx  . Order #: [id number] Class: Historical Med  . Order #: [id number] Class: Print  . Order #: [id number] Class: Historical Med    BP 133/75 (BP Location: Left Arm)   Pulse 80   Temp 98.7 F (37.1 C) (Oral)   Resp 16   Ht 5\' 6"  (1.676 m)   Wt 164 lb 9.6 oz (74.7 kg)   LMP 12/16/2015   SpO2 100%   BMI 26.57 kg/m   Physical Exam  Constitutional: She is oriented to person, place, and time. She appears well-developed and well-nourished.  HENT:  Head: Normocephalic.  Eyes: Pupils are equal, round, and reactive to light.  Neck: Normal range of motion.  Cardiovascular: Normal rate, regular rhythm, normal heart sounds and intact distal pulses.   Pulmonary/Chest: Effort  normal and breath sounds normal.  Abdominal: Soft. Bowel sounds are normal.  Genitourinary: Vagina normal and uterus normal.  Musculoskeletal: Normal range of motion.  Neurological: She is alert and oriented to person, place, and time. She has normal reflexes.  Skin: Skin is warm and dry.  Psychiatric: She has a normal mood and affect. Her behavior is normal. Judgment and thought content normal.    MAU Course  Procedures (including critical care time)  Labs Reviewed  HCG, QUANTITATIVE, PREGNANCY - Abnormal; Notable for the following:       Result Value   hCG, Beta Chain, Quant, S 3,383 (*)    All other components within normal limits  POCT PREGNANCY, URINE - Abnormal; Notable for the following:    Preg Test, Ur POSITIVE (*)    All other components within normal limits  URINALYSIS, ROUTINE W REFLEX MICROSCOPIC   No results found.   1. Threatened abortion in early pregnancy   2. Pelvic pain in female       MDM  preg test pos, u/s shows probable early IUP and reccommends f/u in clinic next week for serial quant and repeat u/s in apx 2 wks. Findings discussed with pt and she verbalized understanding and was d/c home  to f/u in clinic on Monday.

## 2016-02-25 ENCOUNTER — Inpatient Hospital Stay (HOSPITAL_COMMUNITY)
Admission: AD | Admit: 2016-02-25 | Discharge: 2016-02-25 | Disposition: A | Discharge status: Home / Self Care | Payer: Medicaid Other | Source: Ambulatory Visit | Attending: Obstetrics & Gynecology | Admitting: Obstetrics & Gynecology

## 2016-02-25 ENCOUNTER — Encounter (HOSPITAL_COMMUNITY): Payer: Self-pay | Admitting: *Deleted

## 2016-02-25 DIAGNOSIS — R102 Pelvic and perineal pain: Secondary | ICD-10-CM | POA: Diagnosis not present

## 2016-02-25 DIAGNOSIS — Z3A01 Less than 8 weeks gestation of pregnancy: Secondary | ICD-10-CM | POA: Insufficient documentation

## 2016-02-25 DIAGNOSIS — O26899 Other specified pregnancy related conditions, unspecified trimester: Secondary | ICD-10-CM

## 2016-02-25 DIAGNOSIS — R1030 Lower abdominal pain, unspecified: Secondary | ICD-10-CM | POA: Insufficient documentation

## 2016-02-25 DIAGNOSIS — O26891 Other specified pregnancy related conditions, first trimester: Secondary | ICD-10-CM | POA: Insufficient documentation

## 2016-02-25 LAB — WET PREP, GENITAL
CLUE CELLS WET PREP: NONE SEEN
SPERM: NONE SEEN
Trich, Wet Prep: NONE SEEN
YEAST WET PREP: NONE SEEN

## 2016-02-25 LAB — URINALYSIS, ROUTINE W REFLEX MICROSCOPIC
Bilirubin Urine: NEGATIVE
Glucose, UA: NEGATIVE mg/dL
HGB URINE DIPSTICK: NEGATIVE
Ketones, ur: NEGATIVE mg/dL
Leukocytes, UA: NEGATIVE
Nitrite: NEGATIVE
Protein, ur: NEGATIVE mg/dL
SPECIFIC GRAVITY, URINE: 1.024 (ref 1.005–1.030)
pH: 6 (ref 5.0–8.0)

## 2016-02-25 LAB — CBC
HCT: 34.3 % — ABNORMAL LOW (ref 36.0–46.0)
HEMOGLOBIN: 11.8 g/dL — AB (ref 12.0–15.0)
MCH: 28.5 pg (ref 26.0–34.0)
MCHC: 34.4 g/dL (ref 30.0–36.0)
MCV: 82.9 fL (ref 78.0–100.0)
PLATELETS: 160 10*3/uL (ref 150–400)
RBC: 4.14 MIL/uL (ref 3.87–5.11)
RDW: 13.5 % (ref 11.5–15.5)
WBC: 3.1 10*3/uL — ABNORMAL LOW (ref 4.0–10.5)

## 2016-02-25 MED ORDER — ACETAMINOPHEN 500 MG PO TABS
1000.0000 mg | ORAL_TABLET | Freq: Once | ORAL | Status: AC
  Administered 2016-02-25: 1000 mg via ORAL
  Filled 2016-02-25: qty 2

## 2016-02-25 NOTE — Discharge Instructions (Signed)
Round Ligament Pain During Pregnancy   Round ligament pain is a sharp pain or jabbing feeling often felt in the lower belly or groin area on one or both sides. It is one of the most common complaints during pregnancy and is considered a normal part of pregnancy. It is most often felt during the second trimester.   Here is what you need to know about round ligament pain, including some tips to help you feel better.   Causes of Round Ligament Pain   Several thick ligaments surround and support your womb (uterus) as it grows during pregnancy. One of them is called the round ligament.   The round ligament connects the front part of the womb to your groin, the area where your legs attach to your pelvis. The round ligament normally tightens and relaxes slowly.   As your baby and womb grow, the round ligament stretches. That makes it more likely to become strained.   Sudden movements can cause the ligament to tighten quickly, like a rubber band snapping. This causes a sudden and quick jabbing feeling.   Symptoms of Round Ligament Pain   Round ligament pain can be concerning and uncomfortable. But it is considered normal as your body changes during pregnancy.   The symptoms of round ligament pain include a sharp, sudden spasm in the belly. It usually affects the right side, but it may happen on both sides. The pain only lasts a few seconds.   Exercise may cause the pain, as will rapid movements such as:  sneezing  coughing  laughing  rolling over in bed  standing up too quickly   Treatment of Round Ligament Pain   Here are some tips that may help reduce your discomfort:   Pain relief. Take over-the-counter acetaminophen for pain, if necessary. Ask your doctor if this is OK.   Exercise. Get plenty of exercise to keep your stomach (core) muscles strong. Doing stretching exercises or prenatal yoga can be helpful. Ask your doctor which exercises are safe for you and your baby.   A helpful  exercise involves putting your hands and knees on the floor, lowering your head, and pushing your backside into the air.   Avoid sudden movements. Change positions slowly (such as standing up or sitting down) to avoid sudden movements that may cause stretching and pain.   Flex your hips. Bend and flex your hips before you cough, sneeze, or laugh to avoid pulling on the ligaments.   Apply warmth. A heating pad or warm bath may be helpful. Ask your doctor if this is OK. Extreme heat can be dangerous to the baby.   You should try to modify your daily activity level and avoid positions that may worsen the condition.   When to Call the Doctor/Midwife   Always tell your doctor or midwife about any type of pain you have during pregnancy. Round ligament pain is quick and doesn't last long.   Call your health care provider immediately if you have:  severe pain  fever  chills  pain on urination  difficulty walking   Belly pain during pregnancy can be due to many different causes. It is important for your doctor to rule out more serious conditions, including pregnancy complications such as placenta abruption or non-pregnancy illnesses such as:  inguinal hernia  appendicitis  stomach, liver, and kidney problems  Preterm labor pains may sometimes be mistaken for round ligament pain.     Prenatal Care WHAT IS PRENATAL CARE? Prenatal care  is the process of caring for a pregnant woman before she gives birth. Prenatal care makes sure that she and her baby remain as healthy as possible throughout pregnancy. Prenatal care may be provided by a midwife, family practice health care provider, or a childbirth and pregnancy specialist (obstetrician). Prenatal care may include physical examinations, testing, treatments, and education on nutrition, lifestyle, and social support services. WHY IS PRENATAL CARE SO IMPORTANT? Early and consistent prenatal care increases the chance that you and your baby will remain  healthy throughout your pregnancy. This type of care also decreases a babys risk of being born too early (prematurely), or being born smaller than expected (small for gestational age). Any underlying medical conditions you may have that could pose a risk during your pregnancy are discussed during prenatal care visits. You will also be monitored regularly for any new conditions that may arise during your pregnancy so they can be treated quickly and effectively. WHAT HAPPENS DURING PRENATAL CARE VISITS? Prenatal care visits may include the following: Discussion Tell your health care provider about any new signs or symptoms you have experienced since your last visit. These might include:  Nausea or vomiting.  Increased or decreased level of energy.  Difficulty sleeping.  Back or leg pain.  Weight changes.  Frequent urination.  Shortness of breath with physical activity.  Changes in your skin, such as the development of a rash or itchiness.  Vaginal discharge or bleeding.  Feelings of excitement or nervousness.  Changes in your babys movements. You may want to write down any questions or topics you want to discuss with your health care provider and bring them with you to your appointment. Examination During your first prenatal care visit, you will likely have a complete physical exam. Your health care provider will often examine your vagina, cervix, and the position of your uterus, as well as check your heart, lungs, and other body systems. As your pregnancy progresses, your health care provider will measure the size of your uterus and your babys position inside your uterus. He or she may also examine you for early signs of labor. Your prenatal visits may also include checking your blood pressure and, after about 10-12 weeks of pregnancy, listening to your babys heartbeat. Testing Regular testing often includes:  Urinalysis. This checks your urine for glucose, protein, or signs of  infection.  Blood count. This checks the levels of white and red blood cells in your body.  Tests for sexually transmitted infections (STIs). Testing for STIs at the beginning of pregnancy is routinely done and is required in many states.  Antibody testing. You will be checked to see if you are immune to certain illnesses, such as rubella, that can affect a developing fetus.  Glucose screen. Around 24-28 weeks of pregnancy, your blood glucose level will be checked for signs of gestational diabetes. Follow-up tests may be recommended.  Group B strep. This is a bacteria that is commonly found inside a womans vagina. This test will inform your health care provider if you need an antibiotic to reduce the amount of this bacteria in your body prior to labor and childbirth.  Ultrasound. Many pregnant women undergo an ultrasound screening around 18-20 weeks of pregnancy to evaluate the health of the fetus and check for any developmental abnormalities.  HIV (human immunodeficiency virus) testing. Early in your pregnancy, you will be screened for HIV. If you are at high risk for HIV, this test may be repeated during your third trimester of pregnancy.  You may be offered other testing based on your age, personal or family medical history, or other factors. HOW OFTEN SHOULD I PLAN TO SEE MY HEALTH CARE PROVIDER FOR PRENATAL CARE? Your prenatal care check-up schedule depends on any medical conditions you have before, or develop during, your pregnancy. If you do not have any underlying medical conditions, you will likely be seen for checkups:  Monthly, during the first 6 months of pregnancy.  Twice a month during months 7 and 8 of pregnancy.  Weekly starting in the 9th month of pregnancy and until delivery. If you develop signs of early labor or other concerning signs or symptoms, you may need to see your health care provider more often. Ask your health care provider what prenatal care schedule is best for  you. WHAT CAN I DO TO KEEP MYSELF AND MY BABY AS HEALTHY AS POSSIBLE DURING MY PREGNANCY?  Take a prenatal vitamin containing 400 micrograms (0.4 mg) of folic acid every day. Your health care provider may also ask you to take additional vitamins such as iodine, vitamin D, iron, copper, and zinc.  Take 1500-2000 mg of calcium daily starting at your 20th week of pregnancy until you deliver your baby.  Make sure you are up to date on your vaccinations. Unless directed otherwise by your health care provider:  You should receive a tetanus, diphtheria, and pertussis (Tdap) vaccination between the 27th and 36th week of your pregnancy, regardless of when your last Tdap immunization occurred. This helps protect your baby from whooping cough (pertussis) after he or she is born.  You should receive an annual inactivated influenza vaccine (IIV) to help protect you and your baby from influenza. This can be done at any point during your pregnancy.  Eat a well-rounded diet that includes:  Fresh fruits and vegetables.  Lean proteins.  Calcium-rich foods such as milk, yogurt, hard cheeses, and dark, leafy greens.  Whole grain breads.  Do noteat seafood high in mercury, including:  Swordfish.  Tilefish.  Shark.  King mackerel.  More than 6 oz tuna per week.  Do not eat:  Raw or undercooked meats or eggs.  Unpasteurized foods, such as soft cheeses (brie, blue, or feta), juices, and milks.  Lunch meats.  Hot dogs that have not been heated until they are steaming.  Drink enough water to keep your urine clear or pale yellow. For many women, this may be 10 or more 8 oz glasses of water each day. Keeping yourself hydrated helps deliver nutrients to your baby and may prevent the start of pre-term uterine contractions.  Do not use any tobacco products including cigarettes, chewing tobacco, or electronic cigarettes. If you need help quitting, ask your health care provider.  Do not drink  beverages containing alcohol. No safe level of alcohol consumption during pregnancy has been determined.  Do not use any illegal drugs. These can harm your developing baby or cause a miscarriage.  Ask your health care provider or pharmacist before taking any prescription or over-the-counter medicines, herbs, or supplements.  Limit your caffeine intake to no more than 200 mg per day.  Exercise. Unless told otherwise by your health care provider, try to get 30 minutes of moderate exercise most days of the week. Do not  do high-impact activities, contact sports, or activities with a high risk of falling, such as horseback riding or downhill skiing.  Get plenty of rest.  Avoid anything that raises your body temperature, such as hot tubs and saunas.  If  you own a cat, do not empty its litter box. Bacteria contained in cat feces can cause an infection called toxoplasmosis. This can result in serious harm to the fetus.  Stay away from chemicals such as insecticides, lead, mercury, and cleaning or paint products that contain solvents.  Do not have any X-rays taken unless medically necessary.  Take a childbirth and breastfeeding preparation class. Ask your health care provider if you need a referral or recommendation. This information is not intended to replace advice given to you by your health care provider. Make sure you discuss any questions you have with your health care provider. Document Released: 01/11/2003 Document Revised: 06/13/2015 Document Reviewed: 03/25/2013 Elsevier Interactive Patient Education  2017 [employer].   First Trimester of Pregnancy The first trimester of pregnancy is from week 1 until the end of week 12 (months 1 through 3). A week after a sperm fertilizes an egg, the egg will implant on the wall of the uterus. This embryo will begin to develop into a baby. Genes from you and your partner are forming the baby. The female genes determine whether the baby is a boy or a  girl. At 6-8 weeks, the eyes and face are formed, and the heartbeat can be seen on ultrasound. At the end of 12 weeks, all the baby's organs are formed.  Now that you are pregnant, you will want to do everything you can to have a healthy baby. Two of the most important things are to get good prenatal care and to follow your health care provider's instructions. Prenatal care is all the medical care you receive before the baby's birth. This care will help prevent, find, and treat any problems during the pregnancy and childbirth. BODY CHANGES Your body goes through many changes during pregnancy. The changes vary from woman to woman.   You may gain or lose a couple of pounds at first.  You may feel sick to your stomach (nauseous) and throw up (vomit). If the vomiting is uncontrollable, call your health care provider.  You may tire easily.  You may develop headaches that can be relieved by medicines approved by your health care provider.  You may urinate more often. Painful urination may mean you have a bladder infection.  You may develop heartburn as a result of your pregnancy.  You may develop constipation because certain hormones are causing the muscles that push waste through your intestines to slow down.  You may develop hemorrhoids or swollen, bulging veins (varicose veins).  Your breasts may begin to grow larger and become tender. Your nipples may stick out more, and the tissue that surrounds them (areola) may become darker.  Your gums may bleed and may be sensitive to brushing and flossing.  Dark spots or blotches (chloasma, mask of pregnancy) may develop on your face. This will likely fade after the baby is born.  Your menstrual periods will stop.  You may have a loss of appetite.  You may develop cravings for certain kinds of food.  You may have changes in your emotions from day to day, such as being excited to be pregnant or being concerned that something may go wrong with the  pregnancy and baby.  You may have more vivid and strange dreams.  You may have changes in your hair. These can include thickening of your hair, rapid growth, and changes in texture. Some women also have hair loss during or after pregnancy, or hair that feels dry or thin. Your hair will most  likely return to normal after your baby is born. WHAT TO EXPECT AT YOUR PRENATAL VISITS During a routine prenatal visit:  You will be weighed to make sure you and the baby are growing normally.  Your blood pressure will be taken.  Your abdomen will be measured to track your baby's growth.  The fetal heartbeat will be listened to starting around week 10 or 12 of your pregnancy.  Test results from any previous visits will be discussed. Your health care provider may ask you:  How you are feeling.  If you are feeling the baby move.  If you have had any abnormal symptoms, such as leaking fluid, bleeding, severe headaches, or abdominal cramping.  If you are using any tobacco products, including cigarettes, chewing tobacco, and electronic cigarettes.  If you have any questions. Other tests that may be performed during your first trimester include:  Blood tests to find your blood type and to check for the presence of any previous infections. They will also be used to check for low iron levels (anemia) and Rh antibodies. Later in the pregnancy, blood tests for diabetes will be done along with other tests if problems develop.  Urine tests to check for infections, diabetes, or protein in the urine.  An ultrasound to confirm the proper growth and development of the baby.  An amniocentesis to check for possible genetic problems.  Fetal screens for spina bifida and Down syndrome.  You may need other tests to make sure you and the baby are doing well.  HIV (human immunodeficiency virus) testing. Routine prenatal testing includes screening for HIV, unless you choose not to have this test. HOME CARE  INSTRUCTIONS  Medicines   Follow your health care provider's instructions regarding medicine use. Specific medicines may be either safe or unsafe to take during pregnancy.  Take your prenatal vitamins as directed.  If you develop constipation, try taking a stool softener if your health care provider approves. Diet   Eat regular, well-balanced meals. Choose a variety of foods, such as meat or vegetable-based protein, fish, milk and low-fat dairy products, vegetables, fruits, and whole grain breads and cereals. Your health care provider will help you determine the amount of weight gain that is right for you.  Avoid raw meat and uncooked cheese. These carry germs that can cause birth defects in the baby.  Eating four or five small meals rather than three large meals a day may help relieve nausea and vomiting. If you start to feel nauseous, eating a few soda crackers can be helpful. Drinking liquids between meals instead of during meals also seems to help nausea and vomiting.  If you develop constipation, eat more high-fiber foods, such as fresh vegetables or fruit and whole grains. Drink enough fluids to keep your urine clear or pale yellow. Activity and Exercise   Exercise only as directed by your health care provider. Exercising will help you:  Control your weight.  Stay in shape.  Be prepared for labor and delivery.  Experiencing pain or cramping in the lower abdomen or low back is a good sign that you should stop exercising. Check with your health care provider before continuing normal exercises.  Try to avoid standing for long periods of time. Move your legs often if you must stand in one place for a long time.  Avoid heavy lifting.  Wear low-heeled shoes, and practice good posture.  You may continue to have sex unless your health care provider directs you otherwise. Relief of Pain  or Discomfort   Wear a good support bra for breast tenderness.   Take warm sitz baths to  soothe any pain or discomfort caused by hemorrhoids. Use hemorrhoid cream if your health care provider approves.   Rest with your legs elevated if you have leg cramps or low back pain.  If you develop varicose veins in your legs, wear support hose. Elevate your feet for 15 minutes, 3-4 times a day. Limit salt in your diet. Prenatal Care   Schedule your prenatal visits by the twelfth week of pregnancy. They are usually scheduled monthly at first, then more often in the last 2 months before delivery.  Write down your questions. Take them to your prenatal visits.  Keep all your prenatal visits as directed by your health care provider. Safety   Wear your seat belt at all times when driving.  Make a list of emergency phone numbers, including numbers for family, friends, the hospital, and police and fire departments. General Tips   Ask your health care provider for a referral to a local prenatal education class. Begin classes no later than at the beginning of month 6 of your pregnancy.  Ask for help if you have counseling or nutritional needs during pregnancy. Your health care provider can offer advice or refer you to specialists for help with various needs.  Do not use hot tubs, steam rooms, or saunas.  Do not douche or use tampons or scented sanitary pads.  Do not cross your legs for long periods of time.  Avoid cat litter boxes and soil used by cats. These carry germs that can cause birth defects in the baby and possibly loss of the fetus by miscarriage or stillbirth.  Avoid all smoking, herbs, alcohol, and medicines not prescribed by your health care provider. Chemicals in these affect the formation and growth of the baby.  Do not use any tobacco products, including cigarettes, chewing tobacco, and electronic cigarettes. If you need help quitting, ask your health care provider. You may receive counseling support and other resources to help you quit.  Schedule a dentist appointment.  At home, brush your teeth with a soft toothbrush and be gentle when you floss. SEEK MEDICAL CARE IF:   You have dizziness.  You have mild pelvic cramps, pelvic pressure, or nagging pain in the abdominal area.  You have persistent nausea, vomiting, or diarrhea.  You have a bad smelling vaginal discharge.  You have pain with urination.  You notice increased swelling in your face, hands, legs, or ankles. SEEK IMMEDIATE MEDICAL CARE IF:   You have a fever.  You are leaking fluid from your vagina.  You have spotting or bleeding from your vagina.  You have severe abdominal cramping or pain.  You have rapid weight gain or loss.  You vomit blood or material that looks like coffee grounds.  You are exposed to [place] measles and have never had them.  You are exposed to fifth disease or chickenpox.  You develop a severe headache.  You have shortness of breath.  You have any kind of trauma, such as from a fall or a car accident. This information is not intended to replace advice given to you by your health care provider. Make sure you discuss any questions you have with your health care provider. Document Released: 01/02/2001 Document Revised: 01/29/2014 Document Reviewed: 11/18/2012 Elsevier Interactive Patient Education  2017 [employer].

## 2016-02-25 NOTE — MAU Note (Signed)
Pt reports abdominal and back pain that gets worse at night.  Pt states she has no appetite and her stomach hurts worse when she eats.  Pt states all this started about one week ago.

## 2016-02-25 NOTE — MAU Provider Note (Signed)
Chief Complaint: Abdominal Pain and Back Pain   First Provider Initiated Contact with Patient 02/25/16 1432     SUBJECTIVE HPI: Evelyn Sullivan is a 26 y.o. G1P0000 at [redacted]w[redacted]d by 5.5 week [place] who presents to Maternity Admissions reporting low abd pain.   Location: suprapubic/groin Quality: pulling Severity: 5/10 on pain scale Duration: 1 week Context: early pregnancy Timing: intermittent Modifying factors: None hasn't tried anything for the pain Associated signs and symptoms: Pos for decreased appetite. Neg for fever, chills, VB, vaginal discharge, N/V/D/C, urinary complaints.   Past Medical History:  Diagnosis Date  . Anemia   . Headache   . Iron deficiency    OB History  Gravida Para Term Preterm AB Living  1 0 0 0 0 0  SAB TAB Ectopic Multiple Live Births  0 0 0 0      # Outcome Date GA Lbr Len/2nd Weight Sex Delivery Anes PTL Lv  1 Current              Past Surgical History:  Procedure Laterality Date  . EYE SURGERY     Social History   Social History  . Marital status: Single    Spouse name: N/A  . Number of children: N/A  . Years of education: N/A   Occupational History  . Not on file.   Social History Main Topics  . Smoking status: Never Smoker  . Smokeless tobacco: Never Used  . Alcohol use No  . Drug use: No  . Sexual activity: Yes   Other Topics Concern  . Not on file   Social History Narrative  . No narrative on file   Family History  Problem Relation Age of Onset  . Cancer Brother    No current facility-administered medications on file prior to encounter.    Current Outpatient Prescriptions on File Prior to Encounter  Medication Sig Dispense Refill  . ferrous sulfate 325 (65 FE) MG EC tablet Take 325 mg by mouth 3 (three) times daily with meals.    . Prenatal Vit-Fe Fumarate-FA (PRENATAL MULTIVITAMIN) TABS tablet Take 1 tablet by mouth daily at 12 noon.     No Known Allergies  I have reviewed patient's Past Medical Hx, Surgical Hx,  Family Hx, Social Hx, medications and allergies.   Review of Systems  Constitutional: Positive for appetite change. Negative for chills and fever.  Gastrointestinal: Positive for abdominal pain. Negative for abdominal distention, constipation, diarrhea, nausea and vomiting.  Genitourinary: Negative for dyspareunia, dysuria, flank pain, frequency, hematuria, urgency, vaginal bleeding and vaginal discharge.  Musculoskeletal: Negative for back pain.    OBJECTIVE Patient Vitals for the past 24 hrs:  BP Temp Temp src Pulse Resp SpO2  02/25/16 1406 (!) 111/49 98.2 F (36.8 C) Oral 79 17 100 %   Constitutional: Well-developed, well-nourished female in no acute distress.  Cardiovascular: normal rate Respiratory: normal rate and effort.  GI: Abd soft, non-tender, Pos BS x 4 MS: Extremities nontender, no edema, normal ROM Neurologic: Alert and oriented x 4.  GU: Neg CVAT.  SPECULUM EXAM: NEFG, moderate amount of thin, white, mildly malodorous discharge, no blood noted, cervix clean  BIMANUAL: cervix closed; uterus 8-9 week size, no adnexal tenderness or masses. No CMT.  LAB RESULTS Results for orders placed or performed during the hospital encounter of 02/25/16 (from the past 24 hour(s))  Urinalysis, Routine w reflex microscopic     Status: Abnormal   Collection Time: 02/25/16  2:00 PM  Result Value Ref Range  Color, Urine YELLOW YELLOW   APPearance HAZY (A) CLEAR   Specific Gravity, Urine 1.024 1.005 - 1.030   pH 6.0 5.0 - 8.0   Glucose, UA NEGATIVE NEGATIVE mg/dL   Hgb urine dipstick NEGATIVE NEGATIVE   Bilirubin Urine NEGATIVE NEGATIVE   Ketones, ur NEGATIVE NEGATIVE mg/dL   Protein, ur NEGATIVE NEGATIVE mg/dL   Nitrite NEGATIVE NEGATIVE   Leukocytes, UA NEGATIVE NEGATIVE  Wet prep, genital     Status: Abnormal   Collection Time: 02/25/16  2:18 PM  Result Value Ref Range   Yeast Wet Prep HPF POC NONE SEEN NONE SEEN   Trich, Wet Prep NONE SEEN NONE SEEN   Clue Cells Wet Prep  HPF POC NONE SEEN NONE SEEN   WBC, Wet Prep HPF POC FEW (A) NONE SEEN   Sperm NONE SEEN   CBC     Status: Abnormal   Collection Time: 02/25/16  2:29 PM  Result Value Ref Range   WBC 3.1 (L) 4.0 - 10.5 K/uL   RBC 4.14 3.87 - 5.11 MIL/uL   Hemoglobin 11.8 (L) 12.0 - 15.0 g/dL   HCT 16.134.3 (L) 09.636.0 - 04.546.0 %   MCV 82.9 78.0 - 100.0 fL   MCH 28.5 26.0 - 34.0 pg   MCHC 34.4 30.0 - 36.0 g/dL   RDW 40.913.5 81.111.5 - 91.415.5 %   Platelets 160 150 - 400 K/uL    IMAGING FHR 171 by informal BS US.   MAU COURSE Orders Placed This Encounter  Procedures  . Wet prep, genital  . Urinalysis, Routine w reflex microscopic  . HIV antibody (routine testing) (NOT [facility])  . CBC  . Apply heat to affected area  . Discharge patient   Meds ordered this encounter  Medications  . acetaminophen (TYLENOL) tablet 1,000 mg    MDM - Low abd pan in pregnancy w/ IUP previously confirmed, Pos cardiac activity. Nml wet prep, UA and exam today. Gc/Chlamydia pending. Suspect round ligament pain. No evidence of emergent condition.   ASSESSMENT 1. Pain of round ligament affecting pregnancy, antepartum     PLAN Discharge home in stable condition. First trimester precautions Increase fluids. Have small, frequent, bland meals. GC/Chlamydia cultures pending Follow-up Information    Your Obstetrician Follow up.   Why:  Start prenatal care       [facility] Follow up.   Why:  as needed in pregnancy emergencies  Contact information: [address] [id number] [place]c [place] [id number] [phone]         Allergies as of 02/25/2016   No Known Allergies     Medication List    TAKE these medications   ferrous sulfate 325 (65 FE) MG EC tablet Take 325 mg by mouth 3 (three) times daily with meals.   prenatal multivitamin Tabs tablet Take 1 tablet by mouth daily at 12 noon.      [place] Olevia Westervelt, [place] 02/25/2016  3:11 PM

## 2016-02-26 LAB — HIV ANTIBODY (ROUTINE TESTING W REFLEX): HIV SCREEN 4TH GENERATION: NONREACTIVE

## 2016-02-27 LAB — GC/CHLAMYDIA PROBE AMP (~~LOC~~) NOT AT ARMC
Chlamydia: NEGATIVE
Neisseria Gonorrhea: NEGATIVE

## 2016-03-08 ENCOUNTER — Encounter (HOSPITAL_COMMUNITY): Payer: Self-pay

## 2016-03-08 ENCOUNTER — Inpatient Hospital Stay (HOSPITAL_COMMUNITY)
Admission: AD | Admit: 2016-03-08 | Discharge: 2016-03-08 | Disposition: A | Discharge status: Home / Self Care | Payer: Medicaid Other | Source: Ambulatory Visit | Attending: Obstetrics & Gynecology | Admitting: Obstetrics & Gynecology

## 2016-03-08 DIAGNOSIS — R1030 Lower abdominal pain, unspecified: Secondary | ICD-10-CM | POA: Insufficient documentation

## 2016-03-08 DIAGNOSIS — O26891 Other specified pregnancy related conditions, first trimester: Secondary | ICD-10-CM

## 2016-03-08 DIAGNOSIS — Z3A09 9 weeks gestation of pregnancy: Secondary | ICD-10-CM | POA: Insufficient documentation

## 2016-03-08 DIAGNOSIS — R109 Unspecified abdominal pain: Secondary | ICD-10-CM | POA: Diagnosis not present

## 2016-03-08 LAB — URINALYSIS, ROUTINE W REFLEX MICROSCOPIC
BILIRUBIN URINE: NEGATIVE
GLUCOSE, UA: NEGATIVE mg/dL
HGB URINE DIPSTICK: NEGATIVE
KETONES UR: NEGATIVE mg/dL
Leukocytes, UA: NEGATIVE
Nitrite: NEGATIVE
PROTEIN: NEGATIVE mg/dL
Specific Gravity, Urine: 1.002 — ABNORMAL LOW (ref 1.005–1.030)
pH: 7 (ref 5.0–8.0)

## 2016-03-08 LAB — WET PREP, GENITAL
Clue Cells Wet Prep HPF POC: NONE SEEN
Sperm: NONE SEEN
Trich, Wet Prep: NONE SEEN
YEAST WET PREP: NONE SEEN

## 2016-03-08 NOTE — MAU Note (Signed)
Pt has had a sharp abd pain since yesterday. Denies vag bleeding or discharge. Has appointment on Monday with [facility].

## 2016-03-08 NOTE — MAU Provider Note (Signed)
History     CSN: [id number]  Arrival date and time: 03/08/16 1657   First Provider Initiated Contact with Patient 03/08/16 1734      Chief Complaint  Patient presents with  . Abdominal Pain   Patient is a 26 year old G1 P0 at 9 weeks 5 days by ultrasound who presents with complaints of lower abdominal pain. Pain is been present for 48 hours. She is unable to describe whether this crampy or sharp in nature. She rates it a 6-7 out of 10. She reports she had a normal bowel movement this morning but has had some small amounts of intermittent diarrhea. She denies any pain with urination. She states the pain is located in the center just above her pubic symphysis. She denies any abnormal vaginal bleeding or vaginal discharge. She denies fevers or chills.   Abdominal Pain  This is a new problem. The current episode started yesterday. The onset quality is sudden. The problem occurs constantly. The problem has been unchanged. The pain is located in the suprapubic region. The pain is at a severity of 7/10. The pain is moderate. Associated symptoms include diarrhea. Pertinent negatives include no anorexia, arthralgias, constipation, dysuria, fever, flatus, frequency, headaches, hematuria, myalgias, nausea or vomiting.    OB History    Gravida Para Term Preterm AB Living   1 0 0 0 0 0   SAB TAB Ectopic Multiple Live Births   0 0 0 0        Past Medical History:  Diagnosis Date  . Anemia   . Headache   . Iron deficiency     Past Surgical History:  Procedure Laterality Date  . EYE SURGERY      Family History  Problem Relation Age of Onset  . Cancer Brother     Social History  Substance Use Topics  . Smoking status: Never Smoker  . Smokeless tobacco: Never Used  . Alcohol use No    Allergies: No Known Allergies  Prescriptions Prior to Admission  Medication Sig Dispense Refill Last Dose  . ferrous sulfate 325 (65 FE) MG EC tablet Take 325 mg by mouth 3 (three) times daily  with meals.   Past Week at Unknown time  . Prenatal Vit-Fe Fumarate-FA (PRENATAL MULTIVITAMIN) TABS tablet Take 1 tablet by mouth daily at 12 noon.   02/25/2016 at Unknown time    Review of Systems  Constitutional: Negative for fever.  HENT: Negative for congestion and rhinorrhea.   Respiratory: Negative for cough and shortness of breath.   Gastrointestinal: Positive for abdominal pain and diarrhea. Negative for anorexia, constipation, flatus, nausea and vomiting.  Genitourinary: Negative for dysuria, frequency and hematuria.  Musculoskeletal: Negative for arthralgias and myalgias.  Neurological: Negative for headaches.   Physical Exam   Blood pressure 129/65, pulse 78, temperature 99.2 F (37.3 C), temperature source Oral, resp. rate 18, height 5\' 6"  (1.676 m), weight 167 lb 1.9 oz (75.8 kg), last menstrual period 12/16/2015.  Physical Exam  Constitutional: She is oriented to person, place, and time. She appears well-developed and well-nourished.  HENT:  Head: Normocephalic and atraumatic.  Cardiovascular: Normal rate and intact distal pulses.   Respiratory: Effort normal. No respiratory distress.  GI: Soft. Bowel sounds are normal. She exhibits no distension and no mass. There is no rebound and no guarding.  Mild suprapubic tenderness  Genitourinary:  Genitourinary Comments: Significant white to greenish vaginal discharge, no cervical motion tenderness, cervix closed, no cervicitis present, healthy pink vaginal mucosa, approximate 10 week  size uterus.  Musculoskeletal: Normal range of motion. She exhibits no edema.  Neurological: She is alert and oriented to person, place, and time. No cranial nerve deficit.  Skin: Skin is warm and dry.  Psychiatric: She has a normal mood and affect. Her behavior is normal.    MAU Course  Procedures  MDM In MA U patient underwent the following evaluation. -Wet prep was collected and showed some WBCs but no other abnormalities -Urine was tested  but showed no urinary tract infection -Gonorrhea and chlamydia was collected and is pending  Patient with no acute abnormalities on laboratory workup has had no vaginal bleeding and has normal vital signs. Patient will be discharged home with supportive care could be early pregnancy abdominal pain but slightly too early for round ligament pain. With no bleeding and previously confirmed intrauterine pregnancy no need to repeat ultrasound today.  Assessment and Plan  #1: Abdominal pain in pregnancy unclear etiology known intrauterine pregnancy. Recommended supportive care Tylenol when necessary for pain.  Ernestina Penna 03/08/2016, 5:47 PM

## 2016-03-08 NOTE — Discharge Instructions (Signed)
Abdominal Pain During Pregnancy °Belly (abdominal) pain is common during pregnancy. Most of the time, it is not a serious problem. Other times, it can be a sign that something is wrong with the pregnancy. Always tell your doctor if you have belly pain. °Follow these instructions at home: °Monitor your belly pain for any changes. The following actions may help you feel better: °· Do not have sex (intercourse) or put anything in your vagina until you feel better. °· Rest until your pain stops. °· Drink clear fluids if you feel sick to your stomach (nauseous). Do not eat solid food until you feel better. °· Only take medicine as told by your doctor. °· Keep all doctor visits as told. °Get help right away if: °· You are bleeding, leaking fluid, or pieces of tissue come out of your vagina. °· You have more pain or cramping. °· You keep throwing up (vomiting). °· You have pain when you pee (urinate) or have blood in your pee. °· You have a fever. °· You do not feel your baby moving as much. °· You feel very weak or feel like passing out. °· You have trouble breathing, with or without belly pain. °· You have a very bad headache and belly pain. °· You have fluid leaking from your vagina and belly pain. °· You keep having watery poop (diarrhea). °· Your belly pain does not go away after resting, or the pain gets worse. °This information is not intended to replace advice given to you by your health care provider. Make sure you discuss any questions you have with your health care provider. °Document Released: 12/27/2008 Document Revised: 08/17/2015 Document Reviewed: 08/07/2012 °Elsevier Interactive Patient Education © 2017 Elsevier Inc. ° °

## 2016-03-09 LAB — GC/CHLAMYDIA PROBE AMP (~~LOC~~) NOT AT ARMC
CHLAMYDIA, DNA PROBE: NEGATIVE
Neisseria Gonorrhea: NEGATIVE

## 2016-03-12 LAB — SICKLE CELL SCREEN: SICKLE CELL SCREEN: NORMAL

## 2016-03-12 LAB — OB RESULTS CONSOLE ANTIBODY SCREEN: ANTIBODY SCREEN: NEGATIVE

## 2016-03-12 LAB — OB RESULTS CONSOLE VARICELLA ZOSTER ANTIBODY, IGG: Varicella: IMMUNE

## 2016-03-12 LAB — OB RESULTS CONSOLE HGB/HCT, BLOOD
HCT: 37 %
Hemoglobin: 12.6 g/dL

## 2016-03-12 LAB — OB RESULTS CONSOLE HEPATITIS B SURFACE ANTIGEN: HEP B S AG: NEGATIVE

## 2016-03-12 LAB — OB RESULTS CONSOLE RUBELLA ANTIBODY, IGM: Rubella: IMMUNE

## 2016-03-12 LAB — OB RESULTS CONSOLE PLATELET COUNT: Platelets: 157 10*3/uL

## 2016-03-12 LAB — OB RESULTS CONSOLE ABO/RH: RH Type: POSITIVE

## 2016-03-12 LAB — OB RESULTS CONSOLE RPR: RPR: NONREACTIVE

## 2016-03-12 LAB — OB RESULTS CONSOLE GC/CHLAMYDIA
Chlamydia: NEGATIVE
Gonorrhea: NEGATIVE

## 2016-03-12 LAB — OB RESULTS CONSOLE HIV ANTIBODY (ROUTINE TESTING): HIV: NONREACTIVE

## 2016-04-10 ENCOUNTER — Encounter (HOSPITAL_COMMUNITY): Payer: Self-pay | Admitting: *Deleted

## 2016-04-10 ENCOUNTER — Inpatient Hospital Stay (HOSPITAL_COMMUNITY)
Admission: AD | Admit: 2016-04-10 | Discharge: 2016-04-10 | Disposition: A | Discharge status: Home / Self Care | Payer: Medicaid Other | Source: Ambulatory Visit | Attending: Family Medicine | Admitting: Family Medicine

## 2016-04-10 DIAGNOSIS — Z3A14 14 weeks gestation of pregnancy: Secondary | ICD-10-CM | POA: Insufficient documentation

## 2016-04-10 DIAGNOSIS — Z3492 Encounter for supervision of normal pregnancy, unspecified, second trimester: Secondary | ICD-10-CM

## 2016-04-10 DIAGNOSIS — R109 Unspecified abdominal pain: Secondary | ICD-10-CM | POA: Diagnosis not present

## 2016-04-10 DIAGNOSIS — O26899 Other specified pregnancy related conditions, unspecified trimester: Secondary | ICD-10-CM

## 2016-04-10 DIAGNOSIS — O26892 Other specified pregnancy related conditions, second trimester: Secondary | ICD-10-CM | POA: Insufficient documentation

## 2016-04-10 LAB — URINALYSIS, ROUTINE W REFLEX MICROSCOPIC
BILIRUBIN URINE: NEGATIVE
Glucose, UA: NEGATIVE mg/dL
Hgb urine dipstick: NEGATIVE
KETONES UR: NEGATIVE mg/dL
Leukocytes, UA: NEGATIVE
NITRITE: NEGATIVE
PH: 7 (ref 5.0–8.0)
Protein, ur: NEGATIVE mg/dL
Specific Gravity, Urine: 1.024 (ref 1.005–1.030)

## 2016-04-10 NOTE — MAU Note (Signed)
Pt C/O lower abd cramping for the last week, denies bleeding.  Pt thinks she had a fever two days ago, did not take temp.

## 2016-04-10 NOTE — MAU Provider Note (Signed)
History     CSN: [id number]  Arrival date and time: 04/10/16 [id number]   First Provider Initiated Contact with Patient 04/10/16 2016      Chief Complaint  Patient presents with  . Abdominal Pain   HPI  Evelyn Sullivan is a 26 y.o. G1P0000 at 724w3d who presents with abdominal pain. [facility]. Reports abdominal pain throughout pregnancy that has worsened over the last week. Was seen for prenatal visit 3 days ago; states she told them about the pain but "they didn't do anything". Pain is intermittent and is worse at night. Describes as sharp pain that she rates 5/10 when it occurs. Has been taking tylenol sporadically without relief. Has an episode of spotting 3 days ago, otherwise no bleeding. Denies n/v/d, constipation, dysuria, vaginal discharge, LOF, or fever. Last BM was today. No intercourse in the last month.   OB History    Gravida Para Term Preterm AB Living   1 0 0 0 0 0   SAB TAB Ectopic Multiple Live Births   0 0 0 0        Past Medical History:  Diagnosis Date  . Anemia   . Headache   . Iron deficiency     Past Surgical History:  Procedure Laterality Date  . EYE SURGERY      Family History  Problem Relation Age of Onset  . Cancer Brother     Social History  Substance Use Topics  . Smoking status: Never Smoker  . Smokeless tobacco: Never Used  . Alcohol use No    Allergies: No Known Allergies  Prescriptions Prior to Admission  Medication Sig Dispense Refill Last Dose  . ferrous sulfate 325 (65 FE) MG EC tablet Take 325 mg by mouth daily.    Past Month at Unknown time  . Prenatal Vit-Fe Fumarate-FA (PRENATAL MULTIVITAMIN) TABS tablet Take 1 tablet by mouth daily at 12 noon.   03/08/2016 at Unknown time    Review of Systems  Constitutional: Negative.   Gastrointestinal: Positive for abdominal pain. Negative for constipation, diarrhea, nausea and vomiting.  Genitourinary: Negative for dysuria, vaginal bleeding and vaginal discharge.   Musculoskeletal: Negative for back pain.   Physical Exam   Blood pressure (!) 119/55, pulse 83, temperature 98.3 F (36.8 C), temperature source Oral, resp. rate 18, last menstrual period 12/16/2015.  Physical Exam  Nursing note and vitals reviewed. Constitutional: She is oriented to person, place, and time. She appears well-developed and well-nourished. No distress.  HENT:  Head: Normocephalic and atraumatic.  Eyes: Conjunctivae are normal. Right eye exhibits no discharge. Left eye exhibits no discharge. No scleral icterus.  Neck: Normal range of motion.  Cardiovascular: Normal rate, regular rhythm and normal heart sounds.   No murmur heard. Respiratory: Effort normal and breath sounds normal. No respiratory distress. She has no wheezes.  GI: Soft. Bowel sounds are normal. There is no tenderness. There is no guarding.  Genitourinary: Cervix exhibits no motion tenderness.  Genitourinary Comments: Cervix closed/thick/high  Neurological: She is alert and oriented to person, place, and time.  Skin: Skin is warm and dry. She is not diaphoretic.  Psychiatric: She has a normal mood and affect. Her behavior is normal. Judgment and thought content normal.    MAU Course  Procedures Results for orders placed or performed during the hospital encounter of 04/10/16 (from the past 24 hour(s))  Urinalysis, Routine w reflex microscopic     Status: Abnormal   Collection Time: 04/10/16  6:52  PM  Result Value Ref Range   Color, Urine YELLOW YELLOW   APPearance HAZY (A) CLEAR   Specific Gravity, Urine 1.024 1.005 - 1.030   pH 7.0 5.0 - 8.0   Glucose, UA NEGATIVE NEGATIVE mg/dL   Hgb urine dipstick NEGATIVE NEGATIVE   Bilirubin Urine NEGATIVE NEGATIVE   Ketones, ur NEGATIVE NEGATIVE mg/dL   Protein, ur NEGATIVE NEGATIVE mg/dL   Nitrite NEGATIVE NEGATIVE   Leukocytes, UA NEGATIVE NEGATIVE    MDM FHT 145 by doppler Cervix closed U/a - no evidence of infection or dehydration VSS, pt  afebrile, NAD Pt texting on phone throughout interview & exam.  Assessment and Plan  A: 1. Abdominal pain affecting pregnancy   2. Fetal heart tones present, second trimester    P: Discharge home Discussed reasons to return to MAU Keep f/u with OB  Judeth Horn 04/10/2016, 8:16 PM

## 2016-04-10 NOTE — Discharge Instructions (Signed)
Abdominal Pain During Pregnancy °Belly (abdominal) pain is common during pregnancy. Most of the time, it is not a serious problem. Other times, it can be a sign that something is wrong with the pregnancy. Always tell your doctor if you have belly pain. °Follow these instructions at home: °Monitor your belly pain for any changes. The following actions may help you feel better: °· Do not have sex (intercourse) or put anything in your vagina until you feel better. °· Rest until your pain stops. °· Drink clear fluids if you feel sick to your stomach (nauseous). Do not eat solid food until you feel better. °· Only take medicine as told by your doctor. °· Keep all doctor visits as told. °Get help right away if: °· You are bleeding, leaking fluid, or pieces of tissue come out of your vagina. °· You have more pain or cramping. °· You keep throwing up (vomiting). °· You have pain when you pee (urinate) or have blood in your pee. °· You have a fever. °· You do not feel your baby moving as much. °· You feel very weak or feel like passing out. °· You have trouble breathing, with or without belly pain. °· You have a very bad headache and belly pain. °· You have fluid leaking from your vagina and belly pain. °· You keep having watery poop (diarrhea). °· Your belly pain does not go away after resting, or the pain gets worse. °This information is not intended to replace advice given to you by your health care provider. Make sure you discuss any questions you have with your health care provider. °Document Released: 12/27/2008 Document Revised: 08/17/2015 Document Reviewed: 08/07/2012 °Elsevier Interactive Patient Education © 2017 Elsevier Inc. ° °

## 2016-04-24 ENCOUNTER — Inpatient Hospital Stay (HOSPITAL_COMMUNITY): Payer: Medicaid Other

## 2016-04-24 ENCOUNTER — Encounter (HOSPITAL_COMMUNITY): Payer: Self-pay | Admitting: *Deleted

## 2016-04-24 ENCOUNTER — Inpatient Hospital Stay (HOSPITAL_COMMUNITY)
Admission: AD | Admit: 2016-04-24 | Discharge: 2016-04-24 | Disposition: A | Discharge status: Home / Self Care | Payer: Medicaid Other | Source: Ambulatory Visit | Attending: Family Medicine | Admitting: Family Medicine

## 2016-04-24 DIAGNOSIS — Z3A16 16 weeks gestation of pregnancy: Secondary | ICD-10-CM | POA: Diagnosis not present

## 2016-04-24 DIAGNOSIS — R102 Pelvic and perineal pain: Secondary | ICD-10-CM | POA: Diagnosis not present

## 2016-04-24 DIAGNOSIS — R109 Unspecified abdominal pain: Secondary | ICD-10-CM

## 2016-04-24 DIAGNOSIS — O26892 Other specified pregnancy related conditions, second trimester: Secondary | ICD-10-CM | POA: Diagnosis not present

## 2016-04-24 DIAGNOSIS — N949 Unspecified condition associated with female genital organs and menstrual cycle: Secondary | ICD-10-CM

## 2016-04-24 LAB — URINALYSIS, ROUTINE W REFLEX MICROSCOPIC
BILIRUBIN URINE: NEGATIVE
Glucose, UA: NEGATIVE mg/dL
HGB URINE DIPSTICK: NEGATIVE
KETONES UR: NEGATIVE mg/dL
Leukocytes, UA: NEGATIVE
Nitrite: NEGATIVE
PROTEIN: NEGATIVE mg/dL
Specific Gravity, Urine: 1.017 (ref 1.005–1.030)
pH: 6 (ref 5.0–8.0)

## 2016-04-24 NOTE — Discharge Instructions (Signed)
Abdominal Pain During Pregnancy Belly (abdominal) pain is common during pregnancy. Most of the time, it is not a serious problem. Other times, it can be a sign that something is wrong with the pregnancy. Always tell your doctor if you have belly pain. Follow these instructions at home: Monitor your belly pain for any changes. The following actions may help you feel better:  Do not have sex (intercourse) or put anything in your vagina until you feel better.  Rest until your pain stops.  Drink clear fluids if you feel sick to your stomach (nauseous). Do not eat solid food until you feel better.  Only take medicine as told by your doctor.  Keep all doctor visits as told. Get help right away if:  You are bleeding, leaking fluid, or pieces of tissue come out of your vagina.  You have more pain or cramping.  You keep throwing up (vomiting).  You have pain when you pee (urinate) or have blood in your pee.  You have a fever.  You do not feel your baby moving as much.  You feel very weak or feel like passing out.  You have trouble breathing, with or without belly pain.  You have a very bad headache and belly pain.  You have fluid leaking from your vagina and belly pain.  You keep having watery poop (diarrhea).  Your belly pain does not go away after resting, or the pain gets worse. This information is not intended to replace advice given to you by your health care provider. Make sure you discuss any questions you have with your health care provider. Document Released: 12/27/2008 Document Revised: 08/17/2015 Document Reviewed: 08/07/2012 Elsevier Interactive Patient Education  2017 Elsevier Inc.   Round Ligament Pain During Pregnancy   Round ligament pain is a sharp pain or jabbing feeling often felt in the lower belly or groin area on one or both sides. It is one of the most common complaints during pregnancy and is considered a normal part of pregnancy. It is most often felt  during the second trimester.   Here is what you need to know about round ligament pain, including some tips to help you feel better.   Causes of Round Ligament Pain   Several thick ligaments surround and support your womb (uterus) as it grows during pregnancy. One of them is called the round ligament.   The round ligament connects the front part of the womb to your groin, the area where your legs attach to your pelvis. The round ligament normally tightens and relaxes slowly.   As your baby and womb grow, the round ligament stretches. That makes it more likely to become strained.   Sudden movements can cause the ligament to tighten quickly, like a rubber band snapping. This causes a sudden and quick jabbing feeling.   Symptoms of Round Ligament Pain   Round ligament pain can be concerning and uncomfortable. But it is considered normal as your body changes during pregnancy.   The symptoms of round ligament pain include a sharp, sudden spasm in the belly. It usually affects the right side, but it may happen on both sides. The pain only lasts a few seconds.   Exercise may cause the pain, as will rapid movements such as:  sneezing  coughing  laughing  rolling over in bed  standing up too quickly   Treatment of Round Ligament Pain   Here are some tips that may help reduce your discomfort:   Pain relief. Take  over-the-counter acetaminophen for pain, if necessary. Ask your doctor if this is OK.   Exercise. Get plenty of exercise to keep your stomach (core) muscles strong. Doing stretching exercises or prenatal yoga can be helpful. Ask your doctor which exercises are safe for you and your baby.   A helpful exercise involves putting your hands and knees on the floor, lowering your head, and pushing your backside into the air.   Avoid sudden movements. Change positions slowly (such as standing up or sitting down) to avoid sudden movements that may cause stretching and pain.   Flex your  hips. Bend and flex your hips before you cough, sneeze, or laugh to avoid pulling on the ligaments.   Apply warmth. A heating pad or warm bath may be helpful. Ask your doctor if this is OK. Extreme heat can be dangerous to the baby.   You should try to modify your daily activity level and avoid positions that may worsen the condition.   When to Call the Doctor/Midwife   Always tell your doctor or midwife about any type of pain you have during pregnancy. Round ligament pain is quick and doesn't last long.   Call your health care provider immediately if you have:  severe pain  fever  chills  pain on urination  difficulty walking   Belly pain during pregnancy can be due to many different causes. It is important for your doctor to rule out more serious conditions, including pregnancy complications such as placenta abruption or non-pregnancy illnesses such as:  inguinal hernia  appendicitis  stomach, liver, and kidney problems  Preterm labor pains may sometimes be mistaken for round ligament pain.

## 2016-04-24 NOTE — MAU Provider Note (Signed)
History     CSN: [id number]  Arrival date and time: 04/24/16 1645   First Provider Initiated Contact with Patient 04/24/16 1739      Chief Complaint  Patient presents with  . Abdominal Pain   HPI  Ms.Evelyn Sullivan is a 26 y.o. female G1P0000 @ [redacted]w[redacted]d here with abdominal pain. She has had frequent visits to the MAU. The pain started at the start of her pregnancy, the pain is there all the time. The pain is located in the lower part of her stomach, on both sides. . The pain comes and goes. She had spotting after intercourse on Friday. The spotting lasted one day and then stopped.    OB History    Gravida Para Term Preterm AB Living   1 0 0 0 0 0   SAB TAB Ectopic Multiple Live Births   0 0 0 0        Past Medical History:  Diagnosis Date  . Anemia   . Headache   . Iron deficiency     Past Surgical History:  Procedure Laterality Date  . EYE SURGERY      Family History  Problem Relation Age of Onset  . Cancer Brother     Social History  Substance Use Topics  . Smoking status: Never Smoker  . Smokeless tobacco: Never Used  . Alcohol use No    Allergies: No Known Allergies  Prescriptions Prior to Admission  Medication Sig Dispense Refill Last Dose  . acetaminophen (TYLENOL) 325 MG tablet Take 325 mg by mouth every 6 (six) hours as needed for moderate pain.   04/23/2016 at Unknown time  . Prenatal Vit-Fe Fumarate-FA (PRENATAL MULTIVITAMIN) TABS tablet Take 1 tablet by mouth daily at 12 noon.   04/24/2016 at Unknown time   Results for orders placed or performed during the hospital encounter of 04/24/16 (from the past 48 hour(s))  Urinalysis, Routine w reflex microscopic     Status: Abnormal   Collection Time: 04/24/16  5:12 PM  Result Value Ref Range   Color, Urine YELLOW YELLOW   APPearance HAZY (A) CLEAR   Specific Gravity, Urine 1.017 1.005 - 1.030   pH 6.0 5.0 - 8.0   Glucose, UA NEGATIVE NEGATIVE mg/dL   Hgb urine dipstick NEGATIVE NEGATIVE   Bilirubin  Urine NEGATIVE NEGATIVE   Ketones, ur NEGATIVE NEGATIVE mg/dL   Protein, ur NEGATIVE NEGATIVE mg/dL   Nitrite NEGATIVE NEGATIVE   Leukocytes, UA NEGATIVE NEGATIVE   Review of Systems  Constitutional: Negative for fever.  Gastrointestinal: Positive for abdominal pain. Negative for nausea and vomiting.   Physical Exam   Blood pressure 130/64, pulse 83, temperature 99.3 F (37.4 C), temperature source Oral, resp. rate 18, weight 177 lb 4 oz (80.4 kg), last menstrual period 12/16/2015, SpO2 100 %.  Physical Exam  Constitutional: She is oriented to person, place, and time. She appears well-developed and well-nourished. No distress.  HENT:  Head: Normocephalic.  GI: Soft. She exhibits no distension and no mass. There is no tenderness. There is no rebound and no guarding.  Genitourinary:  Genitourinary Comments: Dilation: Closed Exam by:: Markel Mergenthaler NP  Musculoskeletal: Normal range of motion.  Neurological: She is alert and oriented to person, place, and time.  Skin: Skin is warm. She is not diaphoretic.  Psychiatric: Her behavior is normal.   MAU Course  Procedures  None  MDM  multiple ED visits with abdominal pain. Cervical length 3.9 Patient is comfortable, conversing.    Assessment and  Plan   A:  1. Abdominal pain in pregnancy, second trimester   2. Round ligament pain     P:  Discharge home in stable condition Pregnancy support belt recommended Return to MAU for emergencies only. Or if symptoms worsen    Duane Lope, NP 04/24/2016 8:27 PM

## 2016-04-24 NOTE — MAU Note (Signed)
Been here 2 times, for pain in lower abd.  Seems to be getting worse, esp if she has been sitting for a while then gets up, lots of pressure..having a lot of back pain, was worse yesterday.  Had spotting Thurs to Sunday, none since.Marland Kitchen

## 2016-05-22 ENCOUNTER — Encounter: Payer: Self-pay | Admitting: Obstetrics and Gynecology

## 2016-05-22 ENCOUNTER — Encounter: Payer: Self-pay | Admitting: *Deleted

## 2016-06-07 ENCOUNTER — Encounter: Payer: Self-pay | Admitting: *Deleted

## 2016-06-07 LAB — HM PAP SMEAR: PAP SMEAR: NEGATIVE

## 2016-06-13 ENCOUNTER — Encounter: Payer: Self-pay | Admitting: Obstetrics and Gynecology

## 2016-06-13 ENCOUNTER — Ambulatory Visit (INDEPENDENT_AMBULATORY_CARE_PROVIDER_SITE_OTHER): Payer: Medicaid Other | Admitting: Obstetrics and Gynecology

## 2016-06-13 VITALS — BP 131/70 | HR 97 | Wt 181.3 lb

## 2016-06-13 DIAGNOSIS — Z3403 Encounter for supervision of normal first pregnancy, third trimester: Secondary | ICD-10-CM | POA: Diagnosis not present

## 2016-06-13 DIAGNOSIS — Z8619 Personal history of other infectious and parasitic diseases: Secondary | ICD-10-CM | POA: Insufficient documentation

## 2016-06-13 DIAGNOSIS — Z3400 Encounter for supervision of normal first pregnancy, unspecified trimester: Secondary | ICD-10-CM

## 2016-06-13 DIAGNOSIS — Z87898 Personal history of other specified conditions: Secondary | ICD-10-CM

## 2016-06-13 LAB — POCT URINALYSIS DIP (DEVICE)
Bilirubin Urine: NEGATIVE
GLUCOSE, UA: NEGATIVE mg/dL
Hgb urine dipstick: NEGATIVE
Ketones, ur: NEGATIVE mg/dL
NITRITE: NEGATIVE
PROTEIN: 30 mg/dL — AB
SPECIFIC GRAVITY, URINE: 1.025 (ref 1.005–1.030)
UROBILINOGEN UA: 0.2 mg/dL (ref 0.0–1.0)
pH: 6.5 (ref 5.0–8.0)

## 2016-06-13 MED ORDER — MICONAZOLE NITRATE 2 % VA CREA: 1.0000 | TOPICAL_CREAM | Freq: Every day | VAGINAL | 2 refills | Status: DC

## 2016-06-13 NOTE — Progress Notes (Signed)
New OB Note  06/13/2016   Clinic: [facility]  Chief Complaint: transfer of care [facility])  History of Present Illness: Ms. Evelyn Sullivan is a 26 y.o. G2P0010 @ 23/4 weeks (EDC 9/15, based on 5wk u/s). Patient's last menstrual period was 12/16/2015.).  Preg complicated by has Supervision of normal first pregnancy, antepartum and History of syncope on her problem list.   She declines any s/s of decreased fetal movement or preterm labor or chest pain or SOB  Patient thought she was being transferred to us because of abdominal pain. Per the records, she's being transferred for a h/o syncope.  Per patient, [place] (pt unsure of name or practice) b/c she had syncopal events. She isn't sure if she did a holter monitor but she states that she was given PRN medication to take.   She last had a syncopal event when she was [facility] and she had a negative maternal echo and normal ECG (non specific changes seen). Normal CBC, CMP, Mg. Leading DDx was VV syncope. She had a normal TSH in 06/2014.   ROS: A 12-point review of systems was performed and negative, except as stated in the above HPI.  OBGYN History: As per HPI. OB History  Gravida Para Term Preterm AB Living  1 0 0 0 0 0  SAB TAB Ectopic Multiple Live Births  0 0 0 0      # Outcome Date GA Lbr Len/2nd Weight Sex Delivery Anes PTL Lv  1 Current                Past Medical History: Past Medical History:  Diagnosis Date  . Anemia   . Headache   . Iron deficiency   . PID (acute pelvic inflammatory disease) 09/07/2013    Past Surgical History: Past Surgical History:  Procedure Laterality Date  . EYE SURGERY      Family History:  Family History  Problem Relation Age of Onset  . Cancer Brother        bone ca  . Seizures Brother   . Hypertension Mother   . Diabetes Father     Social History:  Social History   Social History  .  Marital status: Single    Spouse name: N/A  . Number of children: N/A  . Years of education: N/A   Occupational History  . Not on file.   Social History Main Topics  . Smoking status: Never Smoker  . Smokeless tobacco: Never Used  . Alcohol use No  . Drug use: No  . Sexual activity: Yes   Other Topics Concern  . Not on file   Social History Narrative  . No narrative on file    Allergy: No Known Allergies  Current Outpatient Medications: Prenatal vitamin  Physical Exam:   BP 131/70   Pulse 97   Wt 181 lb 4.8 oz (82.2 kg)   LMP 12/16/2015   BMI 29.26 kg/m  Body mass index is 29.26 kg/m. Contractions: Not present Vag. Bleeding: None. Fundal height: 22 FHTs: 150s  General appearance: Well nourished, well developed female in no acute distress.   Cardiovascular: S1, S2 normal, no murmur, rub or gallop, regular rate and rhythm Respiratory:  Clear to auscultation bilateral. Normal respiratory effort Abdomen: gravid, nttp Neuro/Psych:  Normal mood and affect.  Skin:  Warm and dry.   [facility]  Imaging:  See below.   Assessment: pt stable  Plan: 1. History of syncope Will refer to cardiology. Likely just needs holter and PRN follow up - Ambulatory referral to Cardiology  2. Supervision of normal first pregnancy, antepartum Routine care. Patient states she was told an [facility] of 9/6 and that is what the HD was using.   After going through her notes, It appears that this is based off an u/s by Dr. Shawnie Pons at 12wks. She had first confirmation of viable pregnancy on 1/18 which put her at 5wks 5days with EDC of 9/15. Based on this vs her LMP, will recommend to her next visit that we use 9/15 as her EDC.  Also, I don't see an anatomy u/s in the records or Epic. Will have RNs contact pt to see if she's had one and if not, to schedule one for sometime in the next few weeks  3. H/o HSV Valtrex ppx at 35-36wks  Problem list reviewed and  updated.  Follow up in 2-3 weeks.  >50% of 20 min visit spent on counseling and coordination of care.     Cornelia Copa MD Attending [facility])

## 2016-06-13 NOTE — Progress Notes (Signed)
Home Medicaid Form Completed  

## 2016-06-13 NOTE — Addendum Note (Signed)
Addended by: Faythe CasaBELLAMY, Montrez Marietta M on: 06/13/2016 02:36 PM   Modules accepted: Orders

## 2016-06-14 ENCOUNTER — Telehealth: Payer: Self-pay

## 2016-06-14 NOTE — Telephone Encounter (Signed)
Opened in Error.

## 2016-06-25 ENCOUNTER — Telehealth: Payer: Self-pay | Admitting: *Deleted

## 2016-06-25 DIAGNOSIS — Z87898 Personal history of other specified conditions: Secondary | ICD-10-CM

## 2016-06-25 DIAGNOSIS — Z3400 Encounter for supervision of normal first pregnancy, unspecified trimester: Secondary | ICD-10-CM

## 2016-06-25 NOTE — Telephone Encounter (Signed)
Per chart has not had anatomy ultrasound yet , scheduled ultrasound and called patient with appointment.

## 2016-06-25 NOTE — Telephone Encounter (Signed)
-----   Message from Vivien Rotaheryl A Clinton sent at 06/22/2016  9:56 AM EDT ----- Per Herbert SetaHeather @[email or web address]  this patient needs an US scheduled.  Thanks

## 2016-07-02 ENCOUNTER — Ambulatory Visit (HOSPITAL_COMMUNITY)
Admission: RE | Admit: 2016-07-02 | Discharge: 2016-07-02 | Disposition: A | Discharge status: Home / Self Care | Payer: Medicaid Other | Source: Ambulatory Visit | Attending: Obstetrics and Gynecology | Admitting: Obstetrics and Gynecology

## 2016-07-02 DIAGNOSIS — Z3400 Encounter for supervision of normal first pregnancy, unspecified trimester: Secondary | ICD-10-CM

## 2016-07-02 DIAGNOSIS — Z363 Encounter for antenatal screening for malformations: Secondary | ICD-10-CM | POA: Diagnosis not present

## 2016-07-02 DIAGNOSIS — Z87898 Personal history of other specified conditions: Secondary | ICD-10-CM

## 2016-07-05 ENCOUNTER — Encounter: Payer: Medicaid Other | Admitting: Obstetrics and Gynecology

## 2016-07-11 ENCOUNTER — Encounter: Payer: Self-pay | Admitting: *Deleted

## 2016-07-12 ENCOUNTER — Ambulatory Visit: Payer: Medicaid Other | Admitting: Physician Assistant

## 2016-12-29 ENCOUNTER — Encounter (HOSPITAL_COMMUNITY): Payer: Self-pay

## 2017-11-09 IMAGING — US US OB COMP LESS 14 WK
1 series · 15 of 28 positions shown · non-contrast
Comparison: None.

CLINICAL DATA: Pain for several days. Mid abdominal pain. Estimated
gestational age by last menstrual period equals 6 weeks 1 day

EXAM:
OBSTETRIC <14 WK US AND TRANSVAGINAL OB US
TECHNIQUE: Both transabdominal and transvaginal ultrasound examinations were
performed for complete evaluation of the gestation as well as the
maternal uterus, adnexal regions, and pelvic cul-de-sac.
Transvaginal technique was performed to assess early pregnancy.

[Series 1: us ob comp less 14 wk · 15 of 36 slices shown]
[im 1/36]
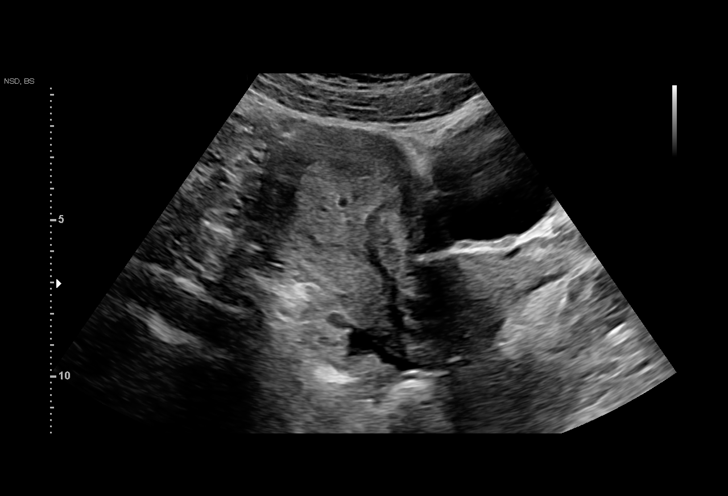
[im 3/36]
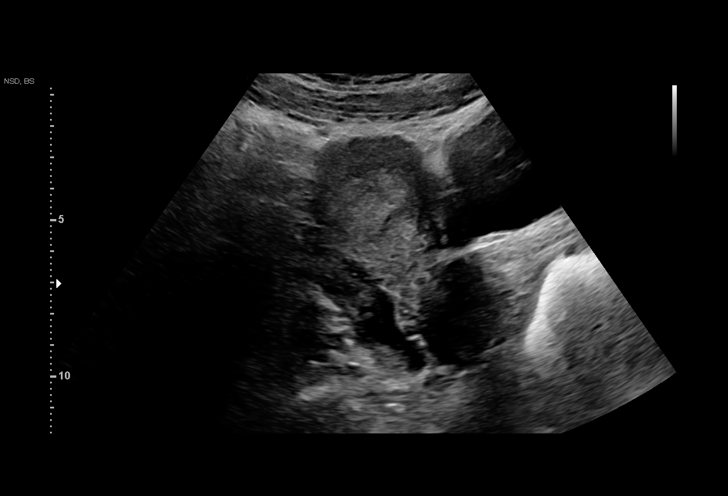
[im 6/36]
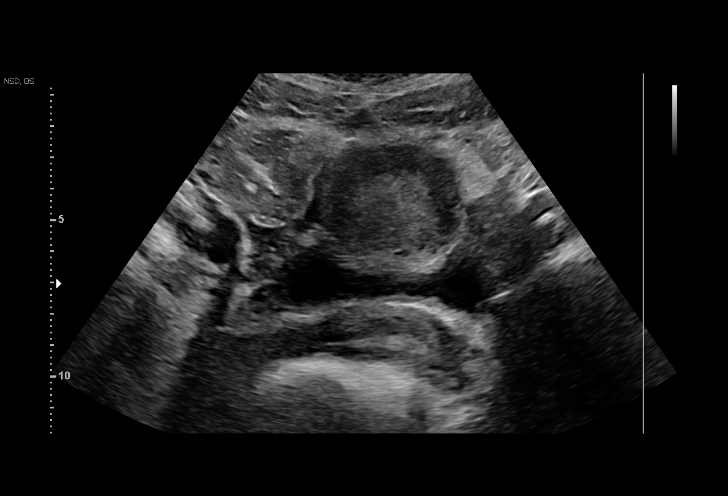
[im 8/36]
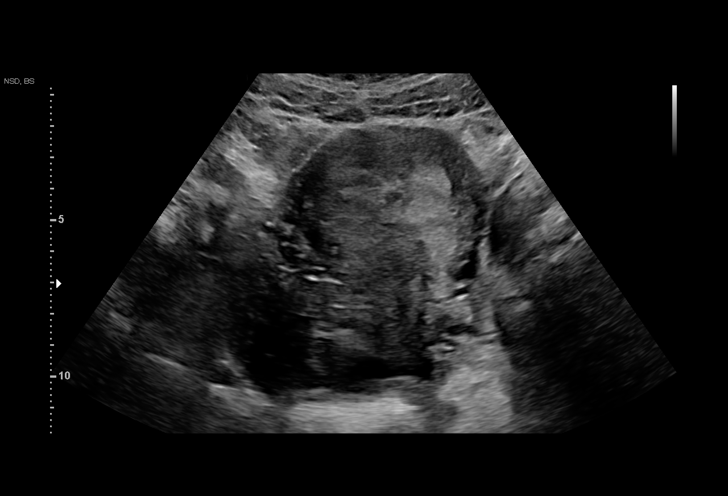
[im 11/36]
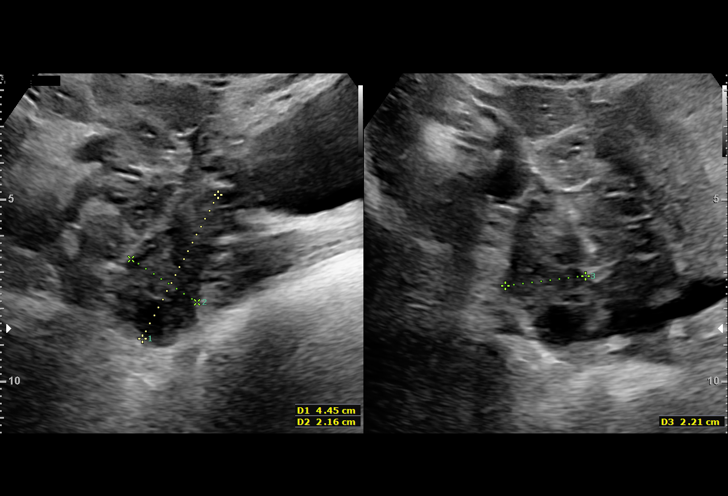
[im 13/36]
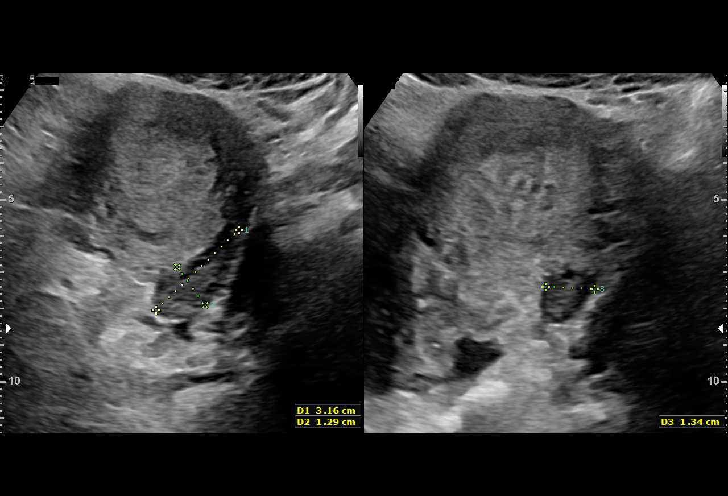
[im 16/36]
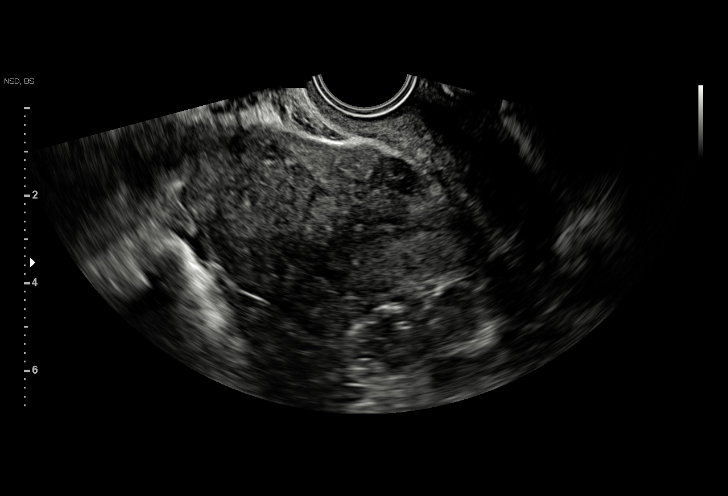
[im 19/36]
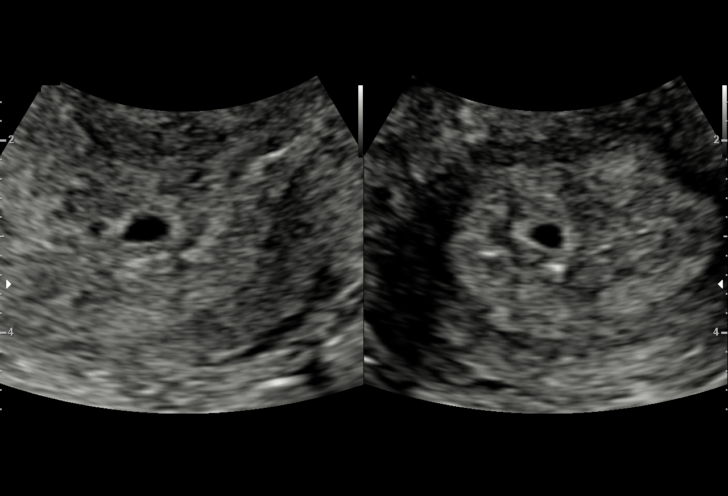
[im 20/36]
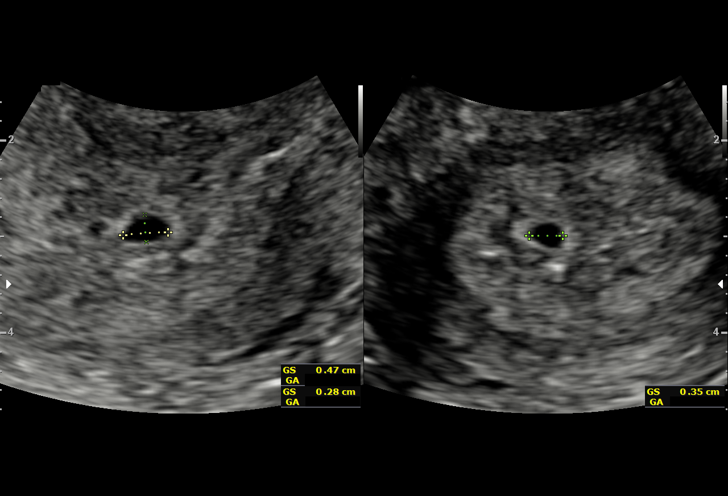
[im 23/36]
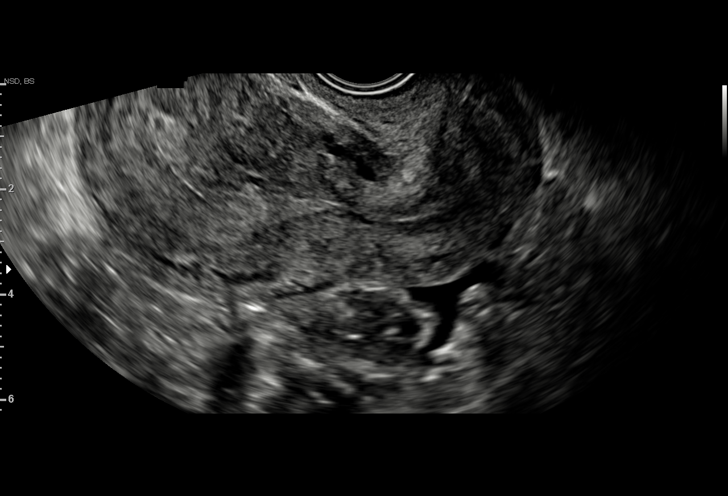
[im 25/36]
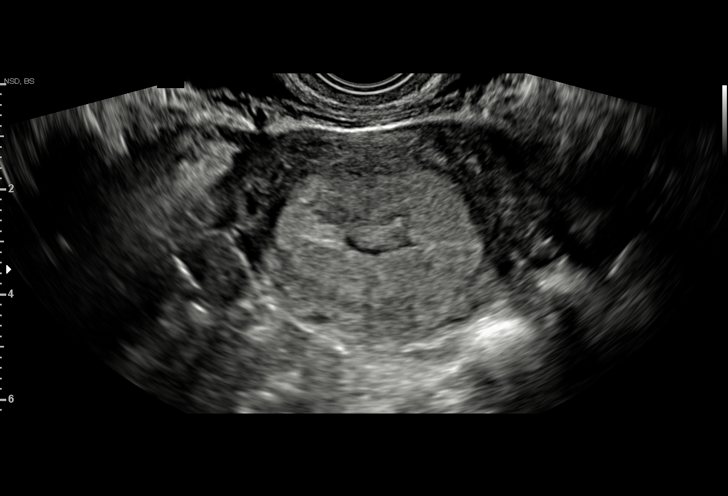
[im 28/36]
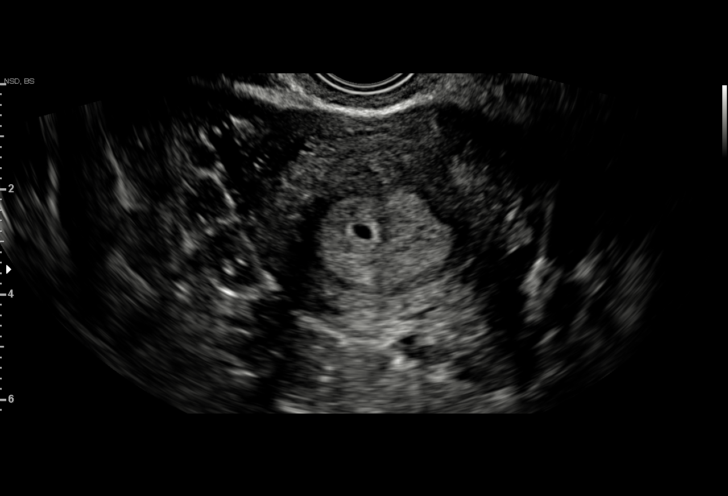
[im 30/36]
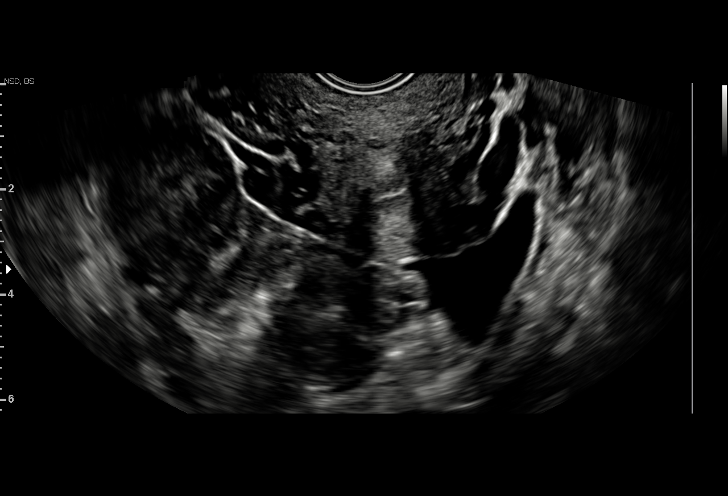
[im 33/36]
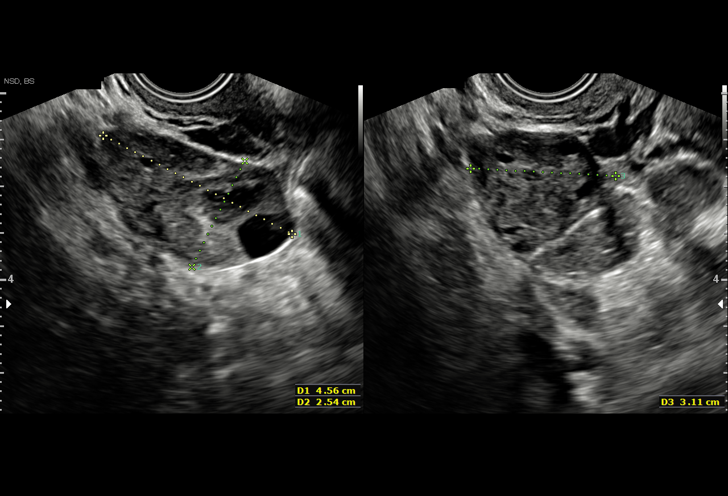
[im 36/36]
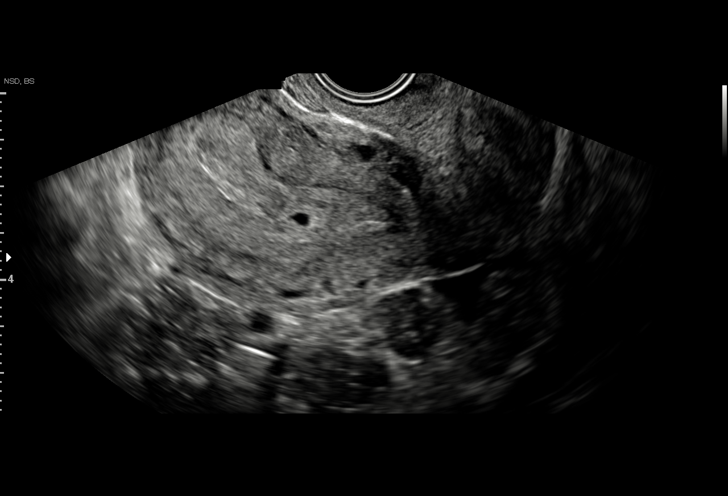

[15 of 28 positions shown; findings below may reference images not displayed]

FINDINGS: Intrauterine gestational sac: Single

Yolk sac:  Not identified

Embryo:  Not identify

MSD: 3.7  mm   5 w   0  d

Subchorionic hemorrhage:  None visualized.

Maternal uterus/adnexae: Normal ovaries.  Small free fluid.
IMPRESSION: Probable early intrauterine gestational sac, but no yolk sac, fetal
pole, or cardiac activity yet visualized. Small free fluid.

Recommend follow-up quantitative B-HCG levels and follow-up US in 14
days to assess viability. This recommendation follows SRU consensus
guidelines: Diagnostic Criteria for Nonviable Pregnancy Early in the
First Trimester. N Engl J Med 9668; [DATE].

## 2017-11-21 IMAGING — US US OB TRANSVAGINAL
1 series · 15 of 28 positions shown · non-contrast
Comparison: 01/28/2016

CLINICAL DATA: Threatened AB.

EXAM:
OBSTETRIC <14 WK US AND TRANSVAGINAL OB US
TECHNIQUE: Both transabdominal and transvaginal ultrasound examinations were
performed for complete evaluation of the gestation as well as the
maternal uterus, adnexal regions, and pelvic cul-de-sac.
Transvaginal technique was performed to assess early pregnancy.

[Series 1: us ob transvaginal · 56 acquisitions, 15 frames shown]
[im 1/56]
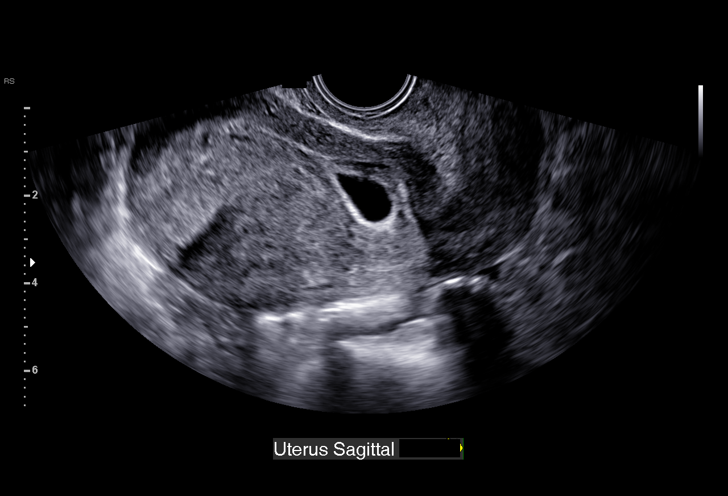
[im 5/56]
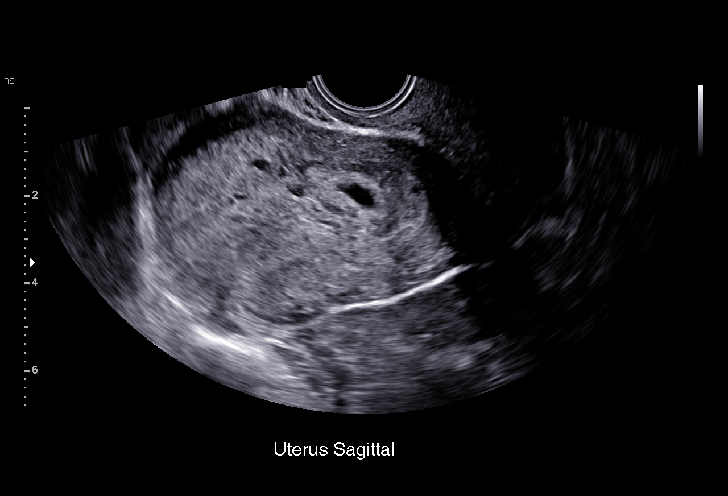
[im 9/56]
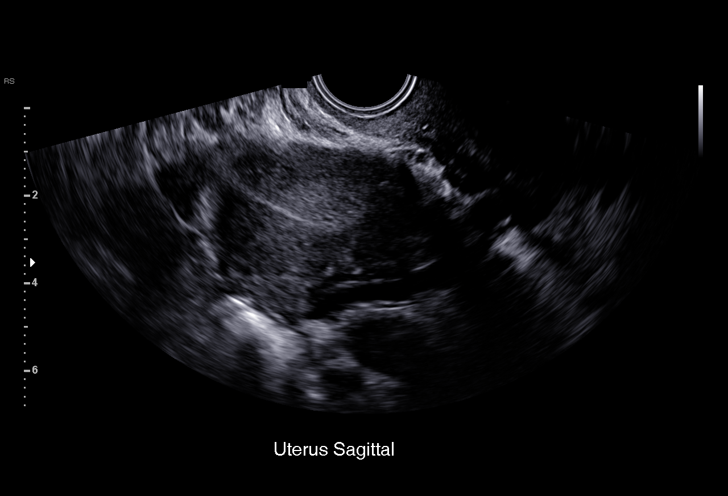
[im 13/56]
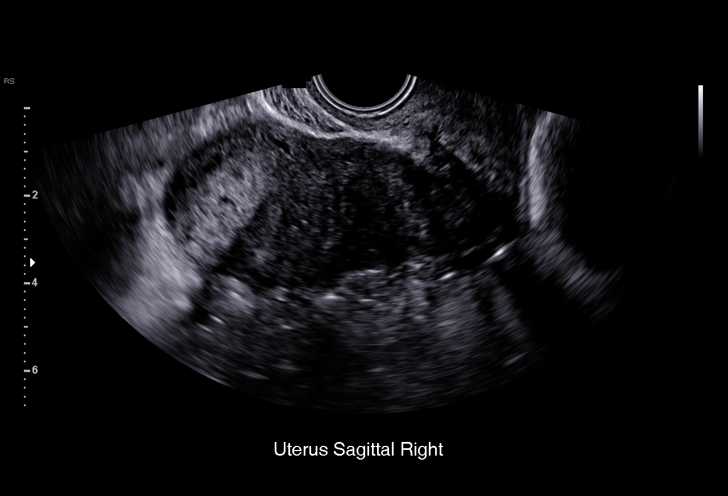
[im 17/56]
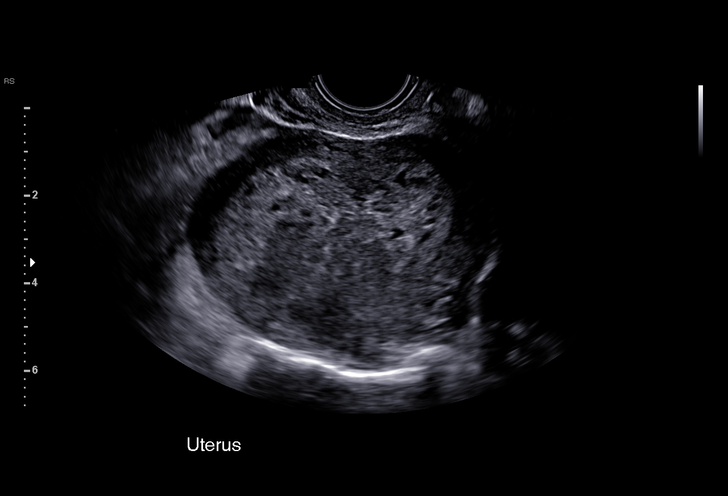
[im 21/56]
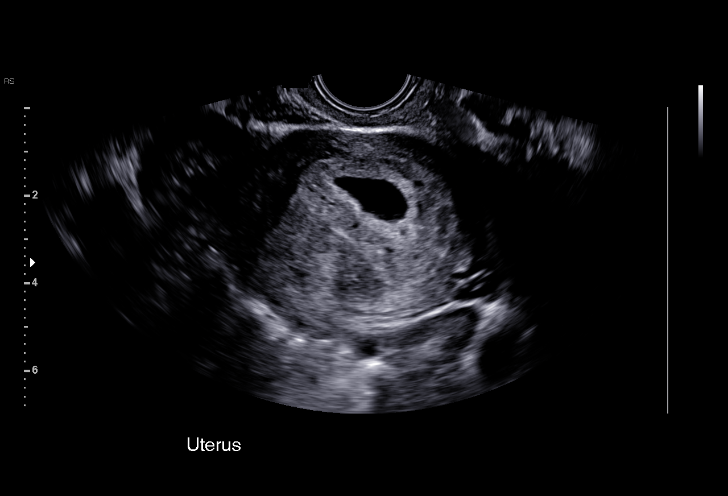
[im 25/56]
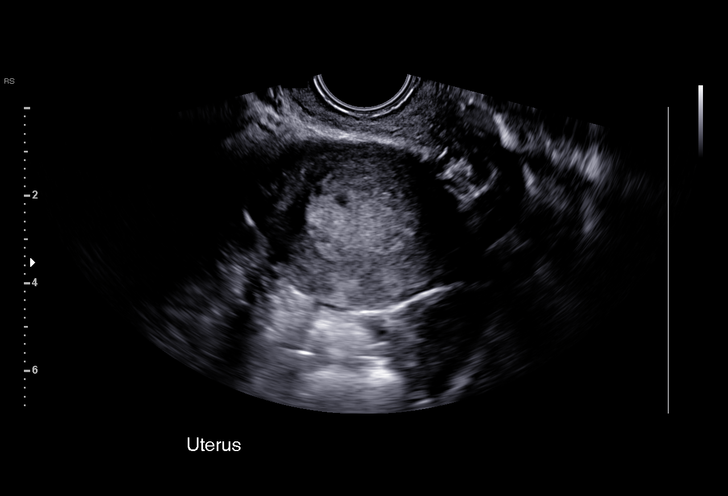
[im 29/56]
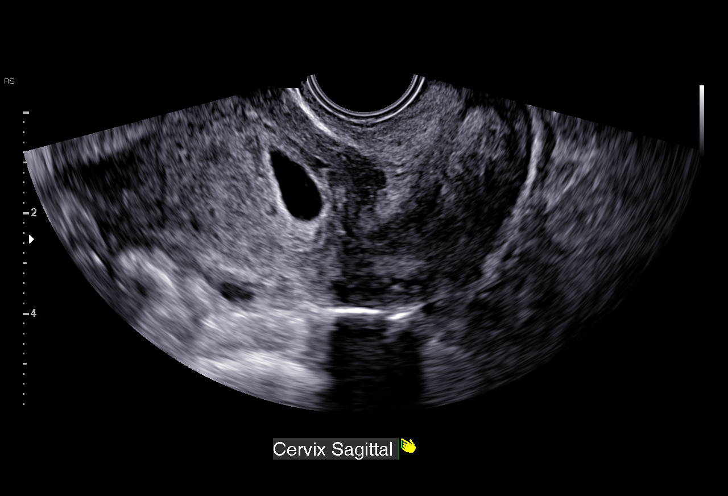
[im 31/56]
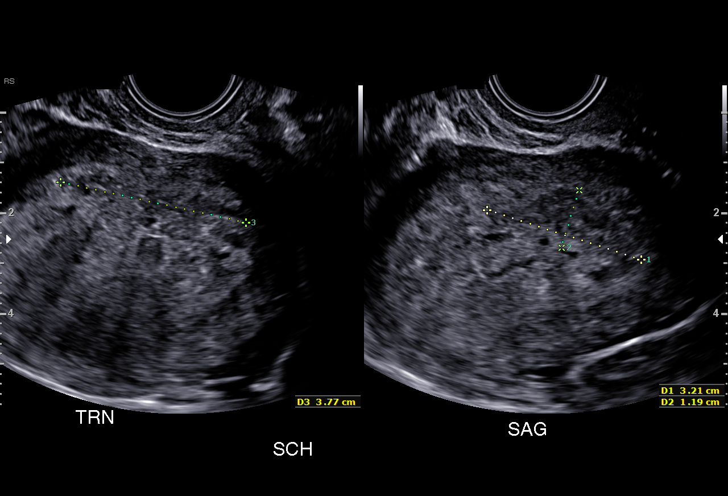
[im 35/56]
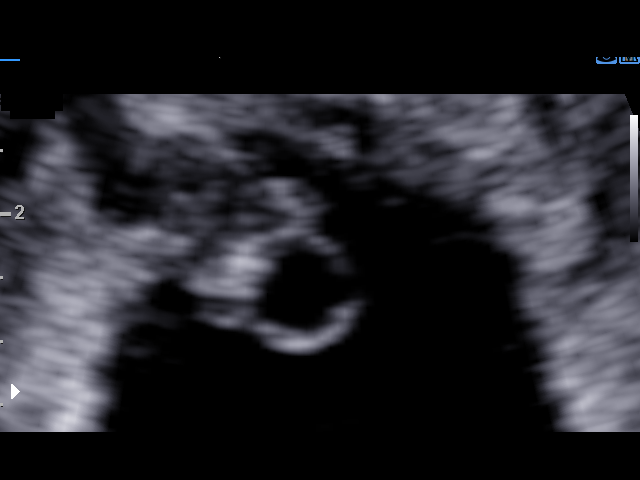
[im 39/56]
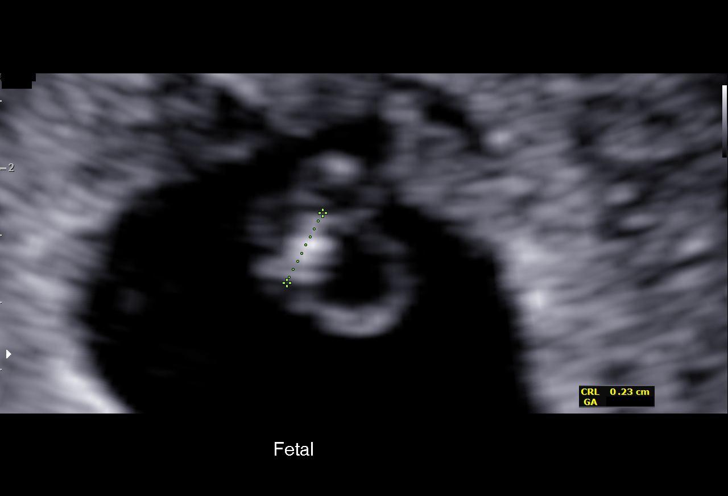
[im 43/56]
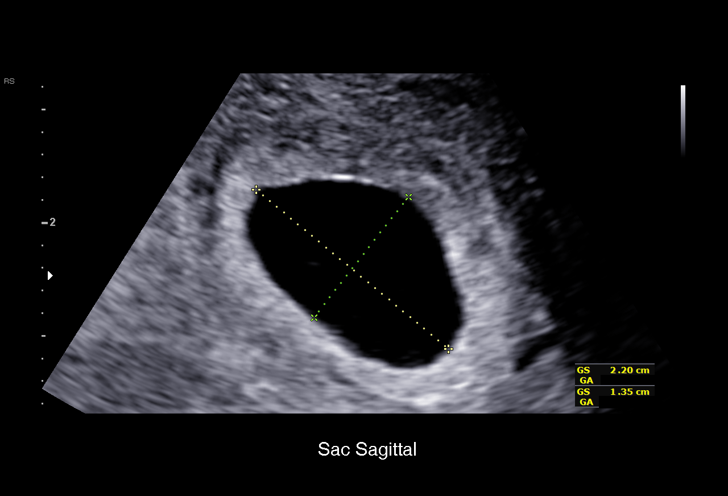
[im 47/56]
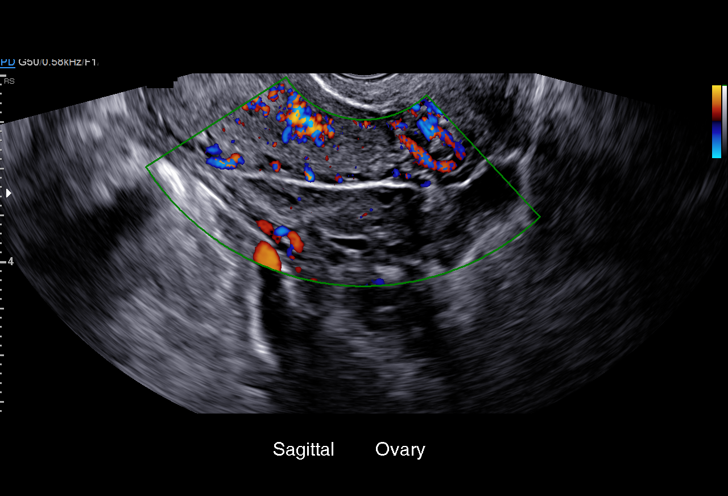
[im 51/56]
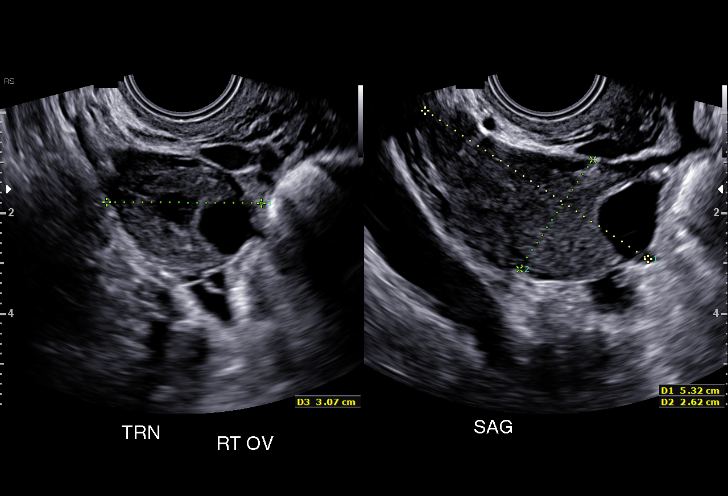
[im 56/56]
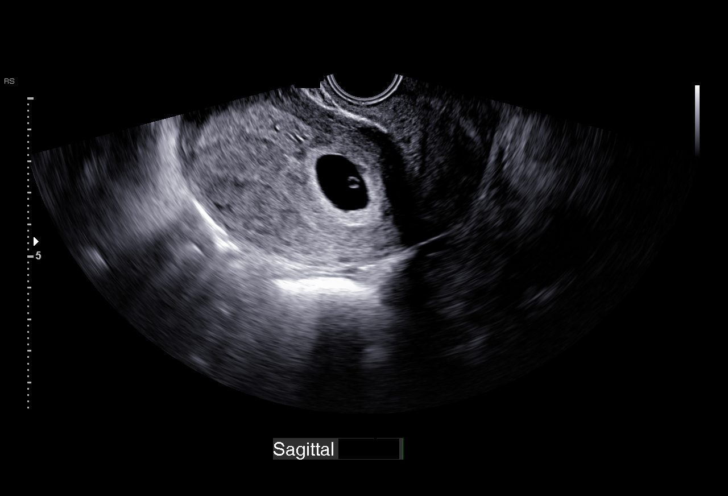

[15 of 28 positions shown; findings below may reference images not displayed]

FINDINGS: Intrauterine gestational sac: Single, located within the lower
uterine segment.

Yolk sac:  Yes

Embryo:  Yes

Cardiac Activity: Yes

Heart Rate: 117  bpm

CRL:  2.6  mm   5 w   5 d                  US EDC: 10/06/2016

Subchorionic hemorrhage:  None visualized.

Maternal uterus/adnexae:

Subchorionic hemorrhage: Moderate subchorionic hemorrhage measures
3.2 x 1.2 x 3.8 cm peer

Right ovary: Normal containing corpus luteal cyst

Left ovary: Normal

Other :None

Free fluid: Small amount of free fluid is noted within the left
adnexa peer
IMPRESSION: 1. Single living intrauterine gestation is identified near the lower
uterine segment with an estimated gestational age of 5 weeks and 5
days.
2. Moderate subchorionic hemorrhage.

## 2018-07-12 ENCOUNTER — Emergency Department (HOSPITAL_COMMUNITY)
Admission: EM | Admit: 2018-07-12 | Discharge: 2018-07-12 | Disposition: A | Discharge status: Home / Self Care | Payer: 59 | Attending: Emergency Medicine | Admitting: Emergency Medicine

## 2018-07-12 ENCOUNTER — Other Ambulatory Visit: Payer: Self-pay

## 2018-07-12 ENCOUNTER — Emergency Department (HOSPITAL_COMMUNITY): Payer: 59

## 2018-07-12 DIAGNOSIS — T7840XA Allergy, unspecified, initial encounter: Secondary | ICD-10-CM | POA: Insufficient documentation

## 2018-07-12 DIAGNOSIS — R55 Syncope and collapse: Secondary | ICD-10-CM | POA: Insufficient documentation

## 2018-07-12 LAB — CBC WITH DIFFERENTIAL/PLATELET
Abs Immature Granulocytes: 0 10*3/uL (ref 0.00–0.07)
Basophils Absolute: 0 10*3/uL (ref 0.0–0.1)
Basophils Relative: 1 %
Eosinophils Absolute: 0 10*3/uL (ref 0.0–0.5)
Eosinophils Relative: 1 %
HCT: 39.3 % (ref 36.0–46.0)
Hemoglobin: 12.4 g/dL (ref 12.0–15.0)
Immature Granulocytes: 0 %
Lymphocytes Relative: 40 %
Lymphs Abs: 1.4 10*3/uL (ref 0.7–4.0)
MCH: 28.1 pg (ref 26.0–34.0)
MCHC: 31.6 g/dL (ref 30.0–36.0)
MCV: 88.9 fL (ref 80.0–100.0)
Monocytes Absolute: 0.3 10*3/uL (ref 0.1–1.0)
Monocytes Relative: 10 %
Neutro Abs: 1.7 10*3/uL (ref 1.7–7.7)
Neutrophils Relative %: 48 %
Platelets: 189 10*3/uL (ref 150–400)
RBC: 4.42 MIL/uL (ref 3.87–5.11)
RDW: 13.7 % (ref 11.5–15.5)
WBC: 3.5 10*3/uL — ABNORMAL LOW (ref 4.0–10.5)
nRBC: 0 % (ref 0.0–0.2)

## 2018-07-12 LAB — BASIC METABOLIC PANEL
Anion gap: 12 (ref 5–15)
BUN: 12 mg/dL (ref 6–20)
CO2: 25 mmol/L (ref 22–32)
Calcium: 8.8 mg/dL — ABNORMAL LOW (ref 8.9–10.3)
Chloride: 98 mmol/L (ref 98–111)
Creatinine, Ser: 0.79 mg/dL (ref 0.44–1.00)
GFR calc Af Amer: >60 mL/min (ref >60)
GFR calc non Af Amer: >60 mL/min (ref >60)
Glucose, Bld: 103 mg/dL — ABNORMAL HIGH (ref 70–99)
Potassium: 3.7 mmol/L (ref 3.5–5.1)
Sodium: 135 mmol/L (ref 135–145)

## 2018-07-12 LAB — I-STAT BETA HCG BLOOD, ED (MC, WL, AP ONLY): I-stat hCG, quantitative: <5 m[IU]/mL (ref <5)

## 2018-07-12 MED ORDER — METHYLPREDNISOLONE SODIUM SUCC 125 MG IJ SOLR
125.0000 mg | Freq: Once | INTRAMUSCULAR | Status: AC
  Administered 2018-07-12: 04:00:00 125 mg via INTRAVENOUS
  Filled 2018-07-12: qty 2

## 2018-07-12 MED ORDER — EPINEPHRINE 0.3 MG/0.3ML IJ SOAJ: INTRAMUSCULAR | 1 refills | Status: AC

## 2018-07-12 MED ORDER — FAMOTIDINE IN NACL 20-0.9 MG/50ML-% IV SOLN
20.0000 mg | Freq: Once | INTRAVENOUS | Status: AC
  Administered 2018-07-12: 20 mg via INTRAVENOUS
  Filled 2018-07-12: qty 50

## 2018-07-12 MED ORDER — DIPHENHYDRAMINE HCL 50 MG/ML IJ SOLN
50.0000 mg | Freq: Once | INTRAMUSCULAR | Status: AC
  Administered 2018-07-12: 50 mg via INTRAVENOUS
  Filled 2018-07-12: qty 1

## 2018-07-12 NOTE — ED Notes (Signed)
Pt still too sleepy to stand and complete orthostatic vital signs.

## 2018-07-12 NOTE — ED Triage Notes (Signed)
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 Pt c/o dizziness weakness, shakes, nausea, and headahce  after fall tonight.

## 2018-07-12 NOTE — ED Notes (Signed)
Patient transported to CT 

## 2018-07-12 NOTE — ED Provider Notes (Signed)
[facility]   CSN: [id number] Arrival date & time: 07/12/18  0223    History   Chief Complaint Chief Complaint  Patient presents with  . Allergic Reaction    HPI Evelyn Sullivan is a 28 y.o. female with no significant past medical history who presents today for evaluation of an allergic reaction.  She was at work and they were having a pot luck approximately 30 minutes prior to arrival.  She has a known pineapple allergy and he usually carries an EpiPen however she left it at home today.  She reports that she started feeling itching and her skin was turning red.  She felt short of breath and got nauseous she did not vomit.  She was walking when she passed out.  She fell striking her head.  She does not remember this. Her nausea, shortness of breath and feeling light headed have all resolved at this point.   Currently her shortness of breath and nausea have both been improving.  She has not taken any medications prior to arrival.  She says that at this point she does not feel like she needs an EpiPen.      HPI  Past Medical History:  Diagnosis Date  . Anemia   . Headache   . Iron deficiency   . PID (acute pelvic inflammatory disease) 09/07/2013    Patient Active Problem List   Diagnosis Date Noted  . Supervision of normal first pregnancy, antepartum 06/13/2016  . History of syncope 06/13/2016  . History of herpes simplex infection 06/13/2016    Past Surgical History:  Procedure Laterality Date  . EYE SURGERY       OB History    Gravida  2   Para  0   Term  0   Preterm  0   AB  1   Living  0     SAB  0   TAB  0   Ectopic  0   Multiple  0   Live Births               Home Medications    Prior to Admission medications   Medication Sig Start Date End Date Taking? Authorizing Provider  EPINEPHrine 0.3 mg/0.3 mL IJ SOAJ injection Inject one device into the side of the thigh for severe allergic  reactions.  May repeat with second device in 10 minutes if needed.  After using call 9-11. 07/12/18   Cristina GongHammond, Elizabeth W, PA-C  miconazole (MONISTAT 7) 2 % vaginal cream Place 1 Applicatorful vaginally at bedtime. Apply for seven nights Patient not taking: Reported on 07/12/2018 06/13/16   [place] BingPickens, Charlie, MD    Family History Family History  Problem Relation Age of Onset  . Cancer Brother        bone ca  . Seizures Brother   . Hypertension Mother   . Diabetes Father     Social History Social History   Tobacco Use  . Smoking status: Never Smoker  . Smokeless tobacco: Never Used  Substance Use Topics  . Alcohol use: No  . Drug use: No     Allergies   Pineapple   Review of Systems Review of Systems  Constitutional: Negative for chills and fever.  HENT: Negative for congestion.   Eyes: Negative for visual disturbance.  Respiratory: Positive for shortness of breath. Negative for wheezing.   Cardiovascular: Negative for chest pain.  Gastrointestinal: Positive for nausea. Negative for abdominal pain, diarrhea and vomiting.  Musculoskeletal: Negative for back pain and neck pain.  Skin: Positive for color change and rash. Negative for wound.  Neurological: Positive for syncope and headaches.  All other systems reviewed and are negative. Shortness of breath, nausea, lightheaded have all resolved at the time of evaluation.  Only current symptoms is skin redness and itching.   Physical Exam Updated Vital Signs BP 132/68   Pulse 68   Temp 98.7 F (37.1 C) (Oral)   Resp (!) 22   SpO2 98%   Physical Exam Vitals signs and nursing note reviewed.  Constitutional:      General: She is not in acute distress.    Appearance: She is well-developed.  HENT:     Head: Normocephalic.     Comments: No tenderness to palpation over the right temporal area without crepitus or deformity palpated.  No raccoon's eyes or battle signs bilaterally.    Nose: Nose normal.     Mouth/Throat:      Mouth: Mucous membranes are moist.     Comments: No intraoral edema. Eyes:     Conjunctiva/sclera: Conjunctivae normal.  Neck:     Musculoskeletal: Normal range of motion and neck supple. No neck rigidity.  Cardiovascular:     Rate and Rhythm: Normal rate and regular rhythm.     Pulses: Normal pulses.     Heart sounds: Normal heart sounds. No murmur.  Pulmonary:     Effort: Pulmonary effort is normal. No respiratory distress.     Breath sounds: Normal breath sounds.  Abdominal:     General: Abdomen is flat. There is no distension.     Palpations: Abdomen is soft. There is no mass.     Tenderness: There is no abdominal tenderness.     Hernia: No hernia is present.  Musculoskeletal:     Right lower leg: No edema.     Left lower leg: No edema.  Skin:    General: Skin is warm and dry.     Comments: Is generally erythematous without urticaria.  Neurological:     General: No focal deficit present.     Mental Status: She is alert and oriented to person, place, and time.  Psychiatric:        Mood and Affect: Mood normal.        Behavior: Behavior normal.      ED Treatments / Results  Labs (all labs ordered are listed, but only abnormal results are displayed) Labs Reviewed  BASIC METABOLIC PANEL - Abnormal; Notable for the following components:      Result Value   Glucose, Bld 103 (*)    Calcium 8.8 (*)    All other components within normal limits  CBC WITH DIFFERENTIAL/PLATELET - Abnormal; Notable for the following components:   WBC 3.5 (*)    All other components within normal limits  I-STAT BETA HCG BLOOD, ED ([facility], [facility], [facility] ONLY)    EKG EKG Interpretation  Date/Time:  Saturday July 12 2018 03:08:19 EDT Ventricular Rate:  76 PR Interval:    QRS Duration: 94 QT Interval:  373 QTC Calculation: 420 R Axis:   83 Text Interpretation:  Sinus arrhythmia ST elev, probable normal early repol pattern Confirmed by Raeford RazorKohut, Stephen [phone]([id number]) on 07/12/2018 7:07:33 AM   Radiology  Ct Head Wo Contrast  Result Date: 07/12/2018 CLINICAL DATA:  Fall.  Head trauma.  Dizziness after pineapple. EXAM: CT HEAD WITHOUT CONTRAST TECHNIQUE: Contiguous axial images were obtained from the base of the skull through the vertex without  intravenous contrast. COMPARISON:  01/30/2012 CT angiogram of the head. FINDINGS: Brain: No evidence of parenchymal hemorrhage or extra-axial fluid collection. No mass lesion, mass effect, or midline shift. No CT evidence of acute infarction. Cerebral volume is age appropriate. No ventriculomegaly. Vascular: No acute abnormality. Skull: No evidence of calvarial fracture. Sinuses/Orbits: The visualized paranasal sinuses are essentially clear. Other:  The mastoid air cells are unopacified. IMPRESSION: No evidence of acute intracranial abnormality. No evidence of calvarial fracture. Electronically Signed   By: Ilona Sorrel M.D.   On: 07/12/2018 05:22    Procedures Procedures (including critical care time)  Medications Ordered in ED Medications  diphenhydrAMINE (BENADRYL) injection 50 mg (50 mg Intravenous Given 07/12/18 0342)  methylPREDNISolone sodium succinate (SOLU-MEDROL) 125 mg/2 mL injection 125 mg (125 mg Intravenous Given 07/12/18 0342)  famotidine (PEPCID) IVPB 20 mg premix (0 mg Intravenous Stopped 07/12/18 0412)     Initial Impression / Assessment and Plan / ED Course  I have reviewed the triage vital signs and the nursing notes.  Pertinent labs & imaging results that were available during my care of the patient were reviewed by me and considered in my medical decision making (see chart for details).  Clinical Course as of Jul 11 736  Sat Jul 12, 2018  0317 Patient reevaluated.  She has not yet gotten meds, nurses were then ordered.  No change in condition.   [EH]  2956 Patient reevaluated, she reports that she is feeling less itchy.  She is resting comfortably in no obvious distress.   [EH]  [id number] Patient reevaluated, all of her symptoms have  resolved.   [EH]    Clinical Course User Index [EH] Lorin Glass, PA-C      Patient presents today for evaluation of allergic reaction.  She is allergic to pineapple and was eating a cake, after which she started to feel like she was having an allergic reaction.  She had a syncopal event prior to arrival with shortness of breath and nausea, however at the time of my evaluation though symptoms had resolved and her only remaining symptom was red itchy skin. Given that her only symptom at present is the skin changes (red, itchy, without urticaria) epinephrine not indicated.   She was treated with IV Benadryl, Pepcid, and steroids.  She was observed in the emergency room for 5 hours without worsening of condition and remained hemodynamically stable while in my care.  Based on her syncopal event and striking her head (right temporal area) on the floor CT head was obtained without evidence of intracranial hemorrhage or other acute abnormalities.  Labs showed slight leukopenia, recommended PCP follow up in 2-4 weeks.  She was offered rx for steroids but refused.  Given Rx for epi pen.   Return precautions were discussed with patient who states their understanding.  At the time of discharge patient denied any unaddressed complaints or concerns.  Patient is agreeable for discharge home.     Final Clinical Impressions(s) / ED Diagnoses   Final diagnoses:  Allergic reaction, initial encounter    ED Discharge Orders         Ordered    EPINEPHrine 0.3 mg/0.3 mL IJ SOAJ injection     07/12/18 0737           Lorin Glass, PA-C 07/12/18 2130    Merrily Pew, MD 07/13/18 1042

## 2018-07-12 NOTE — ED Notes (Signed)
Unable to check orthostatic vs due to pt stating she feels too drowsy from benadryl. Pt sts she just needs to sleep right now.

## 2018-07-12 NOTE — Discharge Instructions (Addendum)
Today you were seen and evaluated for an allergic reaction.  You were given multiple medications in the emergency room including IV benadryl (which may make you sleepy) and IV steroids.    If you develop any concerning symptoms, especially feeling like your throat is closing, shortness of breath, swelling of the face, lips or tongue then please return to the emergency room immediately for evaluation.    If you have mild symptoms such as itching, mild nausea you may take 25-50 mg of benadryl.   This will make you sleepy and you should not drive, operate heavy machinery or perform any task where if you were to fall asleep or make a slow decision it could cause harm to you or someone else.   I have given you a prescription for two epi pens.  Please make sure you keep one with you at all times.  Please consider keeping two pills of benadryl with your epi pen and if you use it take the benadryl and call 9-11.  Please do not store epi-pens in your car or allow them to get very hot or cold as this can make them not work as well.    You were offered steroids but declined.  Your white blood count was slightly low today.  This is most likely a random abnormality, however please get this checked by your primary care doctor in the next month.  Steroids, like you got today, will make your white count go up so re-check in the next two weeks would most likely be inaccurate.    Today you received medications that may make you sleepy or impair your ability to make decisions.  For the next 24 hours please do not drive, operate heavy machinery, care for a small child with out another adult present, or perform any activities that may cause harm to you or someone else if you were to fall asleep or be impaired.

## 2018-07-12 NOTE — ED Notes (Signed)
Pt sleepy from meds, will attempt orthostatic vital signs once pt is more awake.

## 2019-05-28 ENCOUNTER — Other Ambulatory Visit: Payer: Self-pay | Admitting: Allergy

## 2019-05-28 ENCOUNTER — Ambulatory Visit: Payer: 59 | Admitting: Allergy

## 2019-05-28 ENCOUNTER — Other Ambulatory Visit: Payer: Self-pay

## 2019-05-28 ENCOUNTER — Encounter: Payer: Self-pay | Admitting: Allergy

## 2019-05-28 VITALS — BP 118/72 | HR 79 | Temp 97.5°F | Resp 18 | Ht 65.75 in | Wt 176.0 lb

## 2019-05-28 DIAGNOSIS — J3089 Other allergic rhinitis: Secondary | ICD-10-CM | POA: Diagnosis not present

## 2019-05-28 DIAGNOSIS — H1013 Acute atopic conjunctivitis, bilateral: Secondary | ICD-10-CM | POA: Diagnosis not present

## 2019-05-28 DIAGNOSIS — T7800XD Anaphylactic reaction due to unspecified food, subsequent encounter: Secondary | ICD-10-CM | POA: Insufficient documentation

## 2019-05-28 MED ORDER — OLOPATADINE HCL 0.1 % OP SOLN: 1.0000 [drp] | Freq: Two times a day (BID) | OPHTHALMIC | 5 refills | Status: AC | PRN

## 2019-05-28 MED ORDER — FLUTICASONE PROPIONATE 50 MCG/ACT NA SUSP: 1.0000 | Freq: Two times a day (BID) | NASAL | 5 refills | Status: AC

## 2019-05-28 MED ORDER — AZELASTINE-FLUTICASONE 137-50 MCG/ACT NA SUSP: 1.0000 | Freq: Two times a day (BID) | NASAL | 5 refills | Status: DC

## 2019-05-28 MED ORDER — MONTELUKAST SODIUM 10 MG PO TABS: 10.0000 mg | ORAL_TABLET | Freq: Every day | ORAL | 5 refills | Status: AC

## 2019-05-28 NOTE — Assessment & Plan Note (Signed)
   See assessment and plan as above for allergic rhinitis.  

## 2019-05-28 NOTE — Telephone Encounter (Signed)
Sent in flonase and astelin

## 2019-05-28 NOTE — Patient Instructions (Addendum)
Today's skin testing showed - results given.  Positive to grass, trees, ragweed.  Environmental allergies:  Start environmental control measures.  May use over the counter antihistamines such as Zyrtec (cetirizine), Claritin (loratadine), Allegra (fexofenadine), or Xyzal (levocetirizine) daily as needed. Start Singulair (montelukast) 10mg  daily at night. Cautioned that in some children/adults can experience behavioral changes including hyperactivity, agitation, depression, sleep disturbances and suicidal ideations. These side effects are rare, but if you notice them you should notify me and discontinue Singulair (montelukast). Start dymista (fluticasone + azelastine nasal spray combination) 1 spray per nostril twice a day. This replaces Flonase (fluticasone) for now. If it's not covered let know.    May use olopatadine eye drops 0.1% twice a day as needed for itchy/watery eyes.  Do not use over contact lens.  Consider allergy shots if above regimen does not control symptoms.   Food:  Positive to pineapple.  For mild symptoms you can take over the counter antihistamines such as Benadryl and monitor symptoms closely. If symptoms worsen or if you have severe symptoms including breathing issues, throat closure, significant swelling, whole body hives, severe diarrhea and vomiting, lightheadedness then inject epinephrine and seek immediate medical care afterwards.  Food action plan given.   Follow up with allergist in [place].  Reducing Pollen Exposure . Pollen seasons: trees (spring), grass (summer) and ragweed/weeds (fall). [birth year]1980 Keep windows closed in your home and car to lower pollen exposure.  Marland Kitchen air conditioning in the bedroom and throughout the house if possible.  . Avoid going out in dry windy days - especially early morning. . Pollen counts are highest between 5 - 10 AM and on dry, hot and windy days.  . Save outside activities for late afternoon or after a heavy rain,  when pollen levels are lower.  . Avoid mowing of grass if you have grass pollen allergy. Lilian Kapur Be aware that pollen can also be transported indoors on people and pets.  . Dry your clothes in an automatic dryer rather than hanging them outside where they might collect pollen.  . Rinse hair and eyes before bedtime.

## 2019-05-28 NOTE — Telephone Encounter (Signed)
Pharmacy is stating that generic Dymista needs to be split into 2 separate nasal sprays. Please advise directions for use for both nasal sprays. Thank You.

## 2019-05-28 NOTE — Assessment & Plan Note (Signed)
Rhinoconjunctivitis symptoms for the past 7 years mainly during the spring months.  Tried Zyrtec, Claritin, Benadryl, Flonase and over-the-counter eyedrops with minimal benefit.  No previous skin testing or ENT evaluation.  Today's skin testing showed - results given. Positive to grass, trees, ragweed.  Start environmental control measures.  May use over the counter antihistamines such as Zyrtec (cetirizine), Claritin (loratadine), Allegra (fexofenadine), or Xyzal (levocetirizine) daily as needed. Start Singulair (montelukast) 10mg  daily at night. Cautioned that in some children/adults can experience behavioral changes including hyperactivity, agitation, depression, sleep disturbances and suicidal ideations. These side effects are rare, but if you notice them you should notify me and discontinue Singulair (montelukast). Start dymista (fluticasone + azelastine nasal spray combination) 1 spray per nostril twice a day. This replaces Flonase (fluticasone) for now. If it's not covered let know.   May use olopatadine eye drops 0.1% twice a day as needed for itchy/watery eyes.  Do not use over contact lens.  Consider allergy shots if above regimen does not control symptoms.   Advised patient to follow-up with allergist in [place] as she is moving there at the end of the month.

## 2019-05-28 NOTE — Assessment & Plan Note (Addendum)
During pregnancy developed dizziness, pruritus, shortness of breath and uterine contraction after pineapple ingestion.  No previous work-up.  Today's skin prick testing was positive to pineapple.  Continue strict avoidance of pineapples.  For mild symptoms you can take over the counter antihistamines such as Benadryl and monitor symptoms closely. If symptoms worsen or if you have severe symptoms including breathing issues, throat closure, significant swelling, whole body hives, severe diarrhea and vomiting, lightheadedness then inject epinephrine and seek immediate medical care afterwards.  Demonstrated proper epinephrine injectable device use.  Food action plan given.

## 2019-05-28 NOTE — Progress Notes (Signed)
New Patient Note  RE: Evelyn Sullivan MRN: [id number] DOB: 08/11/1990 Date of Office Visit: 05/28/2019  Referring provider: No ref. provider found Primary care provider: Patient, No Pcp Per  Chief Complaint: Allergic Rhinitis   History of Present Illness: I had the pleasure of seeing Evelyn Sullivan for initial evaluation at the [facility] on 05/28/2019. She is a 29 y.o. female, who is self-referred here for the evaluation of allergies.  She reports symptoms of itchy/watery eyes, epistaxis, sneezing, migraines, dizziness, nasal congestion, rhinorrhea. Symptoms have been going on for 7 years. The symptoms are present during the spring months only. Other triggers include exposure to none. Anosmia: no. Headache: yes. She has used zyrtec, Claritin, benadryl, Flonase, OTC eye drops with minimal improvement in symptoms. Sinus infections: no. Previous work up includes: none. Previous ENT evaluation: no. Previous sinus imaging: no. History of nasal polyps: no. Last eye exam: 3 years ago. History of reflux: takes tums and pepto as needed.  Patient is moving to [place] at the end of the month.  Assessment and Plan: Evelyn Sullivan is a 29 y.o. female with: Other allergic rhinitis Rhinoconjunctivitis symptoms for the past 7 years mainly during the spring months.  Tried Zyrtec, Claritin, Benadryl, Flonase and over-the-counter eyedrops with minimal benefit.  No previous skin testing or ENT evaluation.  Today's skin testing showed - results given. Positive to grass, trees, ragweed.  Start environmental control measures.  May use over the counter antihistamines such as Zyrtec (cetirizine), Claritin (loratadine), Allegra (fexofenadine), or Xyzal (levocetirizine) daily as needed. Start Singulair (montelukast) 10mg  daily at night. Cautioned that in some children/adults can experience behavioral changes including hyperactivity, agitation, depression, sleep disturbances and  suicidal ideations. These side effects are rare, but if you notice them you should notify me and discontinue Singulair (montelukast). Start dymista (fluticasone + azelastine nasal spray combination) 1 spray per nostril twice a day. This replaces Flonase (fluticasone) for now. If it's not covered let us know.   May use olopatadine eye drops 0.1% twice a day as needed for itchy/watery eyes.  Do not use over contact lens.  Consider allergy shots if above regimen does not control symptoms.   Advised patient to follow-up with allergist in [place] as she is moving there at the end of the month.  Allergic conjunctivitis of both eyes  See assessment and plan as above for allergic rhinitis.  Allergy with anaphylaxis due to food, subsequent encounter During pregnancy developed dizziness, pruritus, shortness of breath and uterine contraction after pineapple ingestion.  No previous work-up.  Today's skin prick testing was positive to pineapple.  Continue strict avoidance of pineapples.  For mild symptoms you can take over the counter antihistamines such as Benadryl and monitor symptoms closely. If symptoms worsen or if you have severe symptoms including breathing issues, throat closure, significant swelling, whole body hives, severe diarrhea and vomiting, lightheadedness then inject epinephrine and seek immediate medical care afterwards.  Demonstrated proper epinephrine injectable device use.  Food action plan given.   Return if symptoms worsen or fail to improve.  Meds ordered this encounter  Medications  . olopatadine (PATANOL) 0.1 % ophthalmic solution    Sig: Place 1 drop into both eyes 2 (two) times daily as needed (itchy/watery eyes).    Dispense:  5 mL    Refill:  5  . montelukast (SINGULAIR) 10 MG tablet    Sig: Take 1 tablet (10 mg total) by mouth at bedtime.    Dispense:  30 tablet  Refill:  5  . Azelastine-Fluticasone 137-50 MCG/ACT SUSP    Sig: Place 1 spray into the nose  in the morning and at bedtime.    Dispense:  23 g    Refill:  5   Other allergy screening: Asthma: no Rhino conjunctivitis: yes Food allergy: yes  Pineapple causes shortness of breath, dizziness and pruritus, uterine contraction.  This started about 2.5 years ago. No previous testing for this.  Medication allergy: no Hymenoptera allergy: no Urticaria: no Eczema:no History of recurrent infections suggestive of immunodeficency: no  Diagnostics: Skin Testing: Environmental allergy panel and select foods. Positive to grass, trees, ragweed and pineapple.  Results discussed with patient/family. Airborne Adult Perc - 05/28/19 1036    Allergen Manufacturer  Waynette Buttery    Location  Back    Number of Test  59    Panel 1  Select    1. Control-Buffer 50% Glycerol  Negative    2. Control-Histamine 1 mg/ml  2+    3. Albumin saline  Negative    4. Bahia  Negative    5. [place]  2+    6. Johnson  2+    7. Kentucky Blue  Negative    8. Meadow Fescue  Negative    9. Perennial Rye  Negative    10. Sweet Vernal  Negative    11. Timothy  Negative    12. Cocklebur  Negative    13. Burweed Marshelder  Negative    14. Ragweed, short  Negative    15. Ragweed, Giant  Negative    16. Plantain,  English  Negative    17. Lamb's Quarters  Negative    18. Sheep Sorrell  Negative    19. Rough Pigweed  Negative    20. Marsh Elder, Rough  Negative    21. Mugwort, Common  Negative    22. Ash mix  Negative    23. Birch mix  2+    24. Beech American  Negative    25. Box, Elder  Negative    26. Cedar, red  Negative    27. Cottonwood, Guinea-Bissau  Negative    28. Elm mix  Negative    29. Hickory  --   +/-   30. Maple mix  Negative    31. Oak, Guinea-Bissau mix  3+    32. Pecan Pollen  2+    33. Pine mix  Negative    34. Sycamore Eastern  2+    35. Walnut, Black Pollen  Negative    36. Alternaria alternata  Negative    37. Cladosporium Herbarum  Negative    38. Aspergillus mix  Negative    39. Penicillium  mix  Negative    40. Bipolaris sorokiniana (Helminthosporium)  Negative    41. Drechslera spicifera (Curvularia)  Negative    42. Mucor plumbeus  Negative    43. Fusarium moniliforme  Negative    44. Aureobasidium pullulans (pullulara)  Negative    45. Rhizopus oryzae  Negative    46. Botrytis cinera  Negative    47. Epicoccum nigrum  Negative    48. Phoma betae  Negative    49. Candida Albicans  Negative    50. Trichophyton mentagrophytes  Negative    51. Mite, D Farinae  5,000 AU/ml  Negative    52. Mite, D Pteronyssinus  5,000 AU/ml  Negative    53. Cat Hair 10,000 BAU/ml  Negative    54.  Dog Epithelia  Negative  55. Mixed Feathers  Negative    56. Horse Epithelia  Negative    57. Cockroach, German  Negative    58. Mouse  Negative    59. Tobacco Leaf  Negative     Intradermal - 05/28/19 1107    Time Antigen Placed  1107    Allergen Manufacturer  Waynette Buttery    Location  Arm    Number of Test  12    Intradermal  Select    Control  Negative    7 Grass  Negative    Ragweed mix  2+    Weed mix  Negative    Mold 1  Negative    Mold 2  Negative    Mold 3  Negative    Mold 4  Negative    Cat  Negative    Dog  Negative    Cockroach  Negative    Mite mix  Negative    Other  Omitted     Food Adult Perc - 05/28/19 1000    Time Antigen Placed  1036    Allergen Manufacturer  Greer    Location  Back    Number of allergen test  1    63. Pineapple  --   6x3      Past Medical History: Patient Active Problem List   Diagnosis Date Noted  . Other allergic rhinitis 05/28/2019  . Allergic conjunctivitis of both eyes 05/28/2019  . Allergy with anaphylaxis due to food, subsequent encounter 05/28/2019  . Supervision of normal first pregnancy, antepartum 06/13/2016  . History of syncope 06/13/2016  . History of herpes simplex infection 06/13/2016   Past Medical History:  Diagnosis Date  . Anemia   . Headache   . Iron deficiency   . PID (acute pelvic inflammatory disease)  09/07/2013   Past Surgical History: Past Surgical History:  Procedure Laterality Date  . EYE SURGERY     Medication List:  Current Outpatient Medications  Medication Sig Dispense Refill  . EPINEPHrine 0.3 mg/0.3 mL IJ SOAJ injection Inject one device into the side of the thigh for severe allergic reactions.  May repeat with second device in 10 minutes if needed.  After using call 9-11. 2 each 1  . etonogestrel (NEXPLANON) 68 MG IMPL implant Inject into the skin.    . Azelastine-Fluticasone 137-50 MCG/ACT SUSP Place 1 spray into the nose in the morning and at bedtime. 23 g 5  . montelukast (SINGULAIR) 10 MG tablet Take 1 tablet (10 mg total) by mouth at bedtime. 30 tablet 5  . olopatadine (PATANOL) 0.1 % ophthalmic solution Place 1 drop into both eyes 2 (two) times daily as needed (itchy/watery eyes). 5 mL 5   No current facility-administered medications for this visit.   Allergies: Allergies  Allergen Reactions  . Pineapple Hives, Shortness Of Breath and Itching   Social History: Social History   Socioeconomic History  . Marital status: Single    Spouse name: Not on file  . Number of children: Not on file  . Years of education: Not on file  . Highest education level: Not on file  Occupational History  . Not on file  Tobacco Use  . Smoking status: Never Smoker  . Smokeless tobacco: Never Used  Substance and Sexual Activity  . Alcohol use: No  . Drug use: No  . Sexual activity: Yes  Other Topics Concern  . Not on file  Social History Narrative  . Not on file   Social Determinants of  Health   Financial Resource Strain:   . Difficulty of Paying Living Expenses:   Food Insecurity:   . Worried About Programme researcher, broadcasting/film/video in the Last Year:   . Barista in the Last Year:   Transportation Needs:   . Freight forwarder (Medical):   Marland Kitchen Lack of Transportation (Non-Medical):   Physical Activity:   . Days of Exercise per Week:   . Minutes of Exercise per Session:    Stress:   . Feeling of Stress :   Social Connections:   . Frequency of Communication with Friends and Family:   . Frequency of Social Gatherings with Friends and Family:   . Attends Religious Services:   . Active Member of Clubs or Organizations:   . Attends Banker Meetings:   Marland Kitchen Marital Status:    Lives in a 28 year old apartment. Smoking: denies Occupation: Journalist, newspaper HistorySurveyor, minerals in the house: no Engineer, civil (consulting) in the family room: no Carpet in the bedroom: yes Heating: electric Cooling: central Pet: no  Family History: Family History  Problem Relation Age of Onset  . Cancer Brother        bone ca  . Seizures Brother   . Hypertension Mother   . Diabetes Father    Problem                               Relation Asthma                                   No  Eczema                                Daughter  Food allergy                          Sister  Allergic rhino conjunctivitis     No   Review of Systems  Constitutional: Negative for appetite change, chills, fever and unexpected weight change.  HENT: Positive for congestion, postnasal drip, rhinorrhea, sinus pressure and sneezing.   Eyes: Positive for itching.  Respiratory: Negative for cough, chest tightness, shortness of breath and wheezing.   Cardiovascular: Negative for chest pain.  Gastrointestinal: Negative for abdominal pain.  Genitourinary: Negative for difficulty urinating.  Skin: Negative for rash.  Allergic/Immunologic: Positive for environmental allergies and food allergies.  Neurological: Positive for headaches.   Objective: BP 118/72 (BP Location: Right Arm, Patient Position: Sitting, Cuff Size: Normal)   Pulse 79   Temp (!) 97.5 F (36.4 C) (Temporal)   Resp 18   Ht 5' 5.75" (1.67 m)   Wt 176 lb (79.8 kg)   SpO2 97%   BMI 28.62 kg/m  Body mass index is 28.62 kg/m. Physical Exam  Constitutional: She is oriented to person, place, and time. She appears  well-developed and well-nourished.  HENT:  Head: Normocephalic and atraumatic.  Right Ear: External ear normal.  Left Ear: External ear normal.  Nose: Mucosal edema present.  Mouth/Throat: Oropharynx is clear and moist.  Eyes: Conjunctivae and EOM are normal.  Cardiovascular: Normal rate, regular rhythm and normal heart sounds. Exam reveals no gallop and no friction rub.  No murmur heard. Pulmonary/Chest: Effort normal and breath sounds normal. She has no  wheezes. She has no rales.  Abdominal: Soft.  Musculoskeletal:     Cervical back: Neck supple.  Neurological: She is alert and oriented to person, place, and time.  Skin: Skin is warm. No rash noted.  Psychiatric: She has a normal mood and affect. Her behavior is normal.  Nursing note and vitals reviewed.  The plan was reviewed with the patient/family, and all questions/concerned were addressed.  It was my pleasure to see Evelyn Sullivan today and participate in her care. Please feel free to contact me with any questions or concerns.  Sincerely,  Rexene Alberts, DO Allergy & Immunology  [facility] office: [phone] [facility] office: [place] office: [phone]
